# Patient Record
Sex: Female | Born: 1960 | Race: White | Hispanic: No | Marital: Married | Smoking: Former smoker
Health system: Southern US, Community
[De-identification: ages and names within clinical notes are randomized; demographics above are authoritative.]

## PROBLEM LIST (undated history)

## (undated) DIAGNOSIS — K219 Gastro-esophageal reflux disease without esophagitis: Secondary | ICD-10-CM

## (undated) DIAGNOSIS — J189 Pneumonia, unspecified organism: Secondary | ICD-10-CM

## (undated) DIAGNOSIS — J449 Chronic obstructive pulmonary disease, unspecified: Secondary | ICD-10-CM

## (undated) DIAGNOSIS — I10 Essential (primary) hypertension: Secondary | ICD-10-CM

## (undated) DIAGNOSIS — E119 Type 2 diabetes mellitus without complications: Secondary | ICD-10-CM

## (undated) DIAGNOSIS — E785 Hyperlipidemia, unspecified: Secondary | ICD-10-CM

## (undated) DIAGNOSIS — J45909 Unspecified asthma, uncomplicated: Secondary | ICD-10-CM

## (undated) DIAGNOSIS — C801 Malignant (primary) neoplasm, unspecified: Secondary | ICD-10-CM

## (undated) HISTORY — PX: OTHER SURGICAL HISTORY: SHX169

---
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
## 1988-03-05 HISTORY — PX: TUBAL LIGATION: SHX77

## 2006-03-11 ENCOUNTER — Emergency Department: Payer: Self-pay | Admitting: Emergency Medicine

## 2009-05-23 ENCOUNTER — Emergency Department: Payer: Self-pay | Admitting: Emergency Medicine

## 2009-08-16 ENCOUNTER — Emergency Department: Payer: Self-pay | Admitting: Emergency Medicine

## 2010-02-15 ENCOUNTER — Emergency Department: Payer: Self-pay | Admitting: Emergency Medicine

## 2010-09-10 ENCOUNTER — Emergency Department: Payer: Self-pay | Admitting: Emergency Medicine

## 2012-03-05 DIAGNOSIS — B9562 Methicillin resistant Staphylococcus aureus infection as the cause of diseases classified elsewhere: Secondary | ICD-10-CM

## 2012-03-05 HISTORY — DX: Methicillin resistant Staphylococcus aureus infection as the cause of diseases classified elsewhere: B95.62

## 2012-07-16 ENCOUNTER — Emergency Department: Payer: Self-pay | Admitting: Internal Medicine

## 2013-07-03 ENCOUNTER — Ambulatory Visit: Payer: Self-pay | Admitting: Nurse Practitioner

## 2013-10-18 ENCOUNTER — Inpatient Hospital Stay: Payer: Self-pay | Admitting: Internal Medicine

## 2013-10-18 LAB — COMPREHENSIVE METABOLIC PANEL
ALBUMIN: 3.6 g/dL (ref 3.4–5.0)
ALK PHOS: 85 U/L
Anion Gap: 6 — ABNORMAL LOW (ref 7–16)
BILIRUBIN TOTAL: 0.4 mg/dL (ref 0.2–1.0)
BUN: 12 mg/dL (ref 7–18)
CALCIUM: 8.6 mg/dL (ref 8.5–10.1)
CHLORIDE: 109 mmol/L — AB (ref 98–107)
CO2: 27 mmol/L (ref 21–32)
Creatinine: 0.9 mg/dL (ref 0.60–1.30)
EGFR (African American): >60
EGFR (Non-African Amer.): >60
Glucose: 129 mg/dL — ABNORMAL HIGH (ref 65–99)
Osmolality: 285 (ref 275–301)
Potassium: 4 mmol/L (ref 3.5–5.1)
SGOT(AST): 15 U/L (ref 15–37)
SGPT (ALT): 26 U/L
SODIUM: 142 mmol/L (ref 136–145)
TOTAL PROTEIN: 7.3 g/dL (ref 6.4–8.2)

## 2013-10-18 LAB — CBC
HCT: 47.1 % — ABNORMAL HIGH (ref 35.0–47.0)
HGB: 15.4 g/dL (ref 12.0–16.0)
MCH: 29.5 pg (ref 26.0–34.0)
MCHC: 32.6 g/dL (ref 32.0–36.0)
MCV: 90 fL (ref 80–100)
PLATELETS: 158 10*3/uL (ref 150–440)
RBC: 5.21 10*6/uL — AB (ref 3.80–5.20)
RDW: 13.9 % (ref 11.5–14.5)
WBC: 8 10*3/uL (ref 3.6–11.0)

## 2013-10-18 LAB — TROPONIN I: Troponin-I: <0.02 ng/mL

## 2013-10-19 LAB — BASIC METABOLIC PANEL
Anion Gap: 9 (ref 7–16)
BUN: 14 mg/dL (ref 7–18)
CO2: 24 mmol/L (ref 21–32)
CREATININE: 0.75 mg/dL (ref 0.60–1.30)
Calcium, Total: 8.5 mg/dL (ref 8.5–10.1)
Chloride: 111 mmol/L — ABNORMAL HIGH (ref 98–107)
EGFR (African American): >60
EGFR (Non-African Amer.): >60
GLUCOSE: 157 mg/dL — AB (ref 65–99)
Osmolality: 291 (ref 275–301)
Potassium: 3.5 mmol/L (ref 3.5–5.1)
Sodium: 144 mmol/L (ref 136–145)

## 2013-10-19 LAB — CBC WITH DIFFERENTIAL/PLATELET
Basophil #: 0 10*3/uL (ref 0.0–0.1)
Basophil %: 0 %
EOS ABS: 0 10*3/uL (ref 0.0–0.7)
Eosinophil %: 0 %
HCT: 44.9 % (ref 35.0–47.0)
HGB: 14.7 g/dL (ref 12.0–16.0)
Lymphocyte #: 0.8 10*3/uL — ABNORMAL LOW (ref 1.0–3.6)
Lymphocyte %: 4.2 %
MCH: 29.3 pg (ref 26.0–34.0)
MCHC: 32.7 g/dL (ref 32.0–36.0)
MCV: 90 fL (ref 80–100)
MONOS PCT: 1.9 %
Monocyte #: 0.4 x10 3/mm (ref 0.2–0.9)
NEUTROS PCT: 93.9 %
Neutrophil #: 17.1 10*3/uL — ABNORMAL HIGH (ref 1.4–6.5)
Platelet: 167 10*3/uL (ref 150–440)
RBC: 5.01 10*6/uL (ref 3.80–5.20)
RDW: 14.3 % (ref 11.5–14.5)
WBC: 18.3 10*3/uL — ABNORMAL HIGH (ref 3.6–11.0)

## 2014-06-26 NOTE — Discharge Summary (Signed)
PATIENT NAME:  Heather Nguyen, Heather Nguyen MR#:  [id number] DATE OF BIRTH:  December 24, 1960  DATE OF ADMISSION:  10/18/2013 DATE OF DISCHARGE: 10/19/2013  ADMITTING PHYSICIAN: Ennis Forts, MD  DISCHARGING PHYSICIAN: Gladstone Lighter, MD  PRIMARY PHYSICIAN: [facility].  [place]: None.  DISCHARGE DIAGNOSES: 1. Acute respiratory failure secondary to chronic obstructive pulmonary disease and asthma exacerbation. 2. Tobacco disorder.  DISCHARGE MEDICATIONS:  1.  Motrin 200 mg 1-2 tablets q. 6 hours p.r.n. for pain. 2.  Albuterol inhaler 2 puffs q. 4 hours p.r.n. for shortness of breath. 3.  Prednisone taper over 12 days. 4.  Spiriva inhaler 1 capsule daily. 5.  Advair 100/50 one puff b.i.d. 6.  Levaquin 500 mg p.o. daily for 5 days. 7.  Discharged on home oxygen 2 liters.  DISCHARGE DIET: Regular diet.  DISCHARGE ACTIVITY: As tolerated.  FOLLOWUP INSTRUCTIONS: PCP followup in 1-2 days.  LABORATORIES AND IMAGING STUDIES PRIOR TO DISCHARGE: WBC 18.3, hemoglobin 15.7, hematocrit 44.9, platelet count is 167,000.  Sodium 144, potassium 3.5, chloride 111, bicarbonate 24, BUN 14, creatinine 0.75, glucose 157, calcium of 8.5.  Chest x-ray on admission showing clear lung fields, no acute cardiopulmonary disease, reticular scarring noted in left upper lobe lingula.  ALT 26, AST 15, alkaline phosphatase 85, total bilirubin 0.4, albumin of 3.6. Troponin has been negative.  BRIEF HOSPITAL COURSE: [name] is a 54 year old obese Caucasian female with past medical history significant for COPD and asthma and ongoing smoking, comes to the hospital secondary to respiratory difficulty. 1. Acute respiratory failure secondary to asthma and COPD exacerbation, not on any home oxygen and chronically smokes. She was very hypoxic and tachypneic, that she had to be placed on high-flow nasal cannula. Next morning on my examination, the patient was significantly wheezing but she  was able to be weaned to 2 liters O2 and the patient wanted to leave, she was feeling better and back to baseline. She was still needing 2 liters of oxygen as her O2 saturations at rest on room air were 83%. She was strongly counseled about staying for 1 more day, however, the patient says she feels fine and had to take care of some stuff at home. She did qualify for home O2 because of her COPD diagnosis. She was even ready to leave AMA if I would not discharge her, but I thought maybe giving her oxygen and medications, including prednisone and antibiotic might help her feel better. So, we are discharging her with a close follow up recommendation in 1-2 days. The patient will be discharged on inhalers, prednisone taper and Levaquin at this time and also 2 liters home O2. 2. Tobacco disorder. Strongly counseled about not smoking, especially when she is being discharged on home O2 at this time.  Her condition has been otherwise uneventful in the hospital.  DISCHARGE CONDITION: Stable.  DISCHARGE DISPOSITION: Home.  TIME SPENT ON DISCHARGE: Forty minutes.  ____________________________ Gladstone Lighter, MD rk:TT D: 10/19/2013 13:13:00 ET T: 10/19/2013 14:09:34 ET JOB#: [id number]  cc: Gladstone Lighter, MD, <Dictator> Gladstone Lighter MD ELECTRONICALLY SIGNED 10/20/2013 14:18

## 2014-06-26 NOTE — Discharge Summary (Signed)
PATIENT NAME:  Heather Nguyen, Heather Nguyen MR#:  [id number] DATE OF BIRTH:  07/21/1960  DATE OF ADMISSION:  10/18/2013 DATE OF DISCHARGE: 10/19/2013   ADMITTING PHYSICIAN: Ennis Forts, MD  DISCHARGING PHYSICIAN: Gladstone Lighter, MD  PRIMARY PHYSICIAN: [facility].  [place]: None.  DISCHARGE DIAGNOSES: 1.  Acute respiratory failure secondary to chronic obstructive pulmonary disease and asthma exacerbation. 2.  Tobacco disorder.  DISCHARGE MEDICATIONS:  1.  Motrin 200 mg 1-2 tablets q. 6 hours p.r.n. for pain. 2.  Albuterol inhaler 2 puffs q. 4 hours p.r.n. for shortness of breath. 3.  Prednisone taper over 12 days. 4.  Spiriva inhaler 1 capsule daily. 5.  Advair 100/50 one puff b.i.d. 6.  Levaquin 500 mg p.o. daily for 5 days. 7.  Discharged on home oxygen 2 liters.  DISCHARGE DIET: Regular diet.  DISCHARGE ACTIVITY: As tolerated.  FOLLOWUP INSTRUCTIONS: PCP followup in 1-2 days.  LABORATORIES AND IMAGING STUDIES PRIOR TO DISCHARGE: WBC 18.3, hemoglobin 15.7, hematocrit 44.9, platelet count is 167,000.  Sodium 144, potassium 3.5, chloride 111, bicarbonate 24, BUN 14, creatinine 0.75, glucose 157, calcium of 8.5.  Chest x-ray on admission showing clear lung fields, no acute cardiopulmonary disease, reticular scarring noted in left upper lobe lingula.  ALT 26, AST 15, alkaline phosphatase 85, total bilirubin 0.4, albumin of 3.6. Troponin has been negative.  BRIEF HOSPITAL COURSE: Heather Nguyen is a 54 year old obese Caucasian female with past medical history significant for COPD and asthma and ongoing smoking, comes to the hospital secondary to respiratory difficulty. 1.  Acute respiratory failure secondary to asthma and COPD exacerbation, not on any home oxygen and chronically smokes. She was very hypoxic and tachypneic, that she had to be placed on high-flow nasal cannula. Next morning on my examination, the patient was significantly wheezing but  she was able to be weaned to 2 liters O2 and the patient wanted to leave, she was feeling better and back to baseline. She was still needing 2 liters of oxygen as her O2 saturations at rest on room air were 83%. She was strongly counseled about staying for 1 more day, however, the patient says she feels fine and had to take care of some stuff at home. She did qualify for home O2 because of her COPD diagnosis. She was even ready to leave AMA if I would not discharge her, but I thought maybe giving her oxygen and medications, including prednisone and antibiotic might help her feel better. So, we are discharging her with a close follow up recommendation in 1-2 days. The patient will be discharged on inhalers, prednisone taper and Levaquin at this time and also 2 liters home O2. 2.  Tobacco disorder. Strongly counseled about not smoking, especially when she is being discharged on home O2 at this time.  (Dictation Anomaly) <<MISSING TEXT>> in the hospital.  DISCHARGE CONDITION: Stable.  DISCHARGE DISPOSITION: Home.  TIME SPENT ON DISCHARGE: Forty minutes.   ____________________________ Lennie Muckle, MD hnc:TT D: 10/19/2013 13:13:34 ET T: 10/19/2013 14:09:34 ET JOB#: [id number]  cc: Lennie Muckle, MD, <Dictator>

## 2014-06-26 NOTE — H&P (Signed)
PATIENT NAME:  Heather Nguyen, Heather Nguyen MR#:  [id number] DATE OF BIRTH:  1960/06/14  DATE OF ADMISSION:  10/18/2013  PRIMARY CARE PHYSICIAN: Denton Lank, MD  REFERRING EMERGENCY ROOM PHYSICIAN: Graciella Freer, MD   CHIEF COMPLAINT: Shortness of breath.   HISTORY OF PRESENT ILLNESS: A 54 year old female with history of asthma and smoking. She was in her normal health until 3-4 days ago when her husband started having some flu-like symptoms, cough, sneezing and generalized body aches a few days ago and the patient also started 3 days ago feeling the same. She just thought it was the flu and would go away, but it did not, and last night she tried to take NyQuil to get relief of the symptoms which actually made the problem worse and today morning she felt severe short of breath and so decided to come to Emergency Room. In the ER, she was found hypoxic on room air with severe wheezing and so started on steroid and nebulizer treatment and oxygen therapy and given to hospitalist team for asthma exacerbation. On further questioning, she denies any fever or chills or any sputum production.   REVIEW OF SYSTEMS: CONSTITUTIONAL: Negative for fever. Positive of fatigue and generalized ache and pain. No weight loss or weight gain.  EYES: No blurring or double vision, discharge or redness.  EARS, NOSE, THROAT: No tinnitus, ear pain or hearing loss.  RESPIRATORY: The patient has cough and wheezing and shortness of breath. No sputum production. CARDIOVASCULAR: No chest pain, orthopnea, edema, arrhythmia, palpitation.  GASTROINTESTINAL: No nausea, vomiting, diarrhea, abdominal pain. GENITOURINARY: No dysuria, hematuria, increased frequency. ENDOCRINE: No heat or cold intolerance.  SKIN: No acne, rashes or lesions.  MUSCULOSKELETAL: No pain or swelling in the joints.  NEUROLOGICAL: No numbness, weakness, tremor or vertigo.  PSYCHIATRIC: No anxiety, insomnia, bipolar disorder.   PAST MEDICAL HISTORY: Asthma.   PAST  SURGICAL HISTORY: Some finger surgery for ligamental injury.   SOCIAL HISTORY: She lives with her family, is currently smoking. When she does not work, she smokes almost a pack of cigarettes a day, and when she is working she smokes 7-8 cigarettes on those days. Denies alcohol or any illegal drug use. She is working as a Hotel manager.   FAMILY HISTORY: Positive for coronary artery disease in her father and asthma in her mother.   HOME MEDICATION: She takes inhalers for asthma  PHYSICAL EXAMINATION:  VITAL SIGNS: In ER, temperature 97.8, pulse is 103, respiratory rate was 28-30, and blood pressure was 165/93 and her pulse oximetry was 84 on room air, which came up to 92 with oxygen supplementation.  GENERAL: The patient is fully alert and oriented to time, place and person. Currently using Ventimask oxygen, but cooperative with history taking and physical examination.  HEENT: Head and neck atraumatic. Conjunctivae pink. Oral mucosa moist.  NECK: Supple. No JVD.  RESPIRATORY: Bilateral equal air entry, wheezing present, some use of accessory muscles present and requiring Ventimask oxygen.  CARDIOVASCULAR: S1, S2 present, slight tachycardia. No murmur. Regular rhythm.  ABDOMEN: Soft, nontender. Bowel sounds present. No organomegaly.  SKIN: No rashes.  EXTREMITIES: Legs: No edema.  NEUROLOGICAL: Power 5/5. Follows commands. No gross abnormality.  PSYCHIATRIC: Does not appear in any acute psychiatric illness.   LABORATORY RESULTS: Glucose 234, BUN 12, creatinine 0.9, sodium 142, potassium 4.0, CO2 is 27, chloride is 109, calcium is 8.6. Total protein 7.3, albumin is 3.6, bilirubin 0.4, alkaline phosphate 85, SGOT 15, SGPT 26. Troponin less than 0.02. WBC 8, hemoglobin 15.4, platelet  count is 158,000 and MCV 90. Chest x-ray, PA and lateral is done: No active cardiopulmonary disease.   ASSESSMENT AND PLAN: A 54 year old female with a past medical history of asthma and current smoker, came to the  Emergency Room after having some flu-like symptoms for 3-4 days and severe shortness of breath today.  1.  Acute respiratory failure, hypoxia, requiring Ventimask oxygen, presented with respiratory rate of 30 and hypoxia with the use of accessory muscles for breathing. Currently, she is on oxygen supplementation. We will continue that and will treat underlying cause of asthma. She is feeling slightly better than she presented to Emergency Room.  2.  Acute asthma exacerbation. This might be exacerbated by her viral infection. Chest x-ray is clear. We will give her IV steroid and nebulizer therapy. We will also give montelukast and we will give levofloxacin to cover for any secondary infection.  3.  Smoking. Smoking cessation counseling is done for 5 minutes and offered a nicotine patch. She does not want to have any patch and she is feeling fine without that.   CODE STATUS: Full code.   TOTAL TIME SPENT ON THIS ADMISSION: Fifty minutes.    ____________________________ Ceasar Lund Anselm Jungling, MD vgv:TT D: 10/18/2013 14:14:59 ET T: 10/18/2013 14:42:31 ET JOB#: [id number]  cc: Ceasar Lund. Anselm Jungling, MD, <Dictator> Sarah "Sallie" Posey Pronto, MD Vaughan Basta MD ELECTRONICALLY SIGNED 11/08/2013 9:14

## 2014-07-06 ENCOUNTER — Other Ambulatory Visit: Payer: Self-pay | Admitting: Nurse Practitioner

## 2014-07-06 DIAGNOSIS — Z1231 Encounter for screening mammogram for malignant neoplasm of breast: Secondary | ICD-10-CM

## 2014-07-20 ENCOUNTER — Ambulatory Visit: Payer: Self-pay

## 2014-08-24 ENCOUNTER — Ambulatory Visit: Payer: Self-pay | Attending: Nurse Practitioner

## 2015-06-14 ENCOUNTER — Emergency Department
Admission: EM | Admit: 2015-06-14 | Discharge: 2015-06-14 | Disposition: A | Discharge status: Home / Self Care | Payer: 59 | Source: Home / Self Care | Attending: Emergency Medicine | Admitting: Emergency Medicine

## 2015-06-14 ENCOUNTER — Encounter: Payer: Self-pay | Admitting: Emergency Medicine

## 2015-06-14 ENCOUNTER — Emergency Department: Payer: 59

## 2015-06-14 DIAGNOSIS — J45909 Unspecified asthma, uncomplicated: Secondary | ICD-10-CM | POA: Insufficient documentation

## 2015-06-14 DIAGNOSIS — J4542 Moderate persistent asthma with status asthmaticus: Secondary | ICD-10-CM | POA: Diagnosis not present

## 2015-06-14 DIAGNOSIS — R0603 Acute respiratory distress: Secondary | ICD-10-CM

## 2015-06-14 DIAGNOSIS — R0600 Dyspnea, unspecified: Secondary | ICD-10-CM | POA: Insufficient documentation

## 2015-06-14 DIAGNOSIS — J45901 Unspecified asthma with (acute) exacerbation: Secondary | ICD-10-CM

## 2015-06-14 DIAGNOSIS — F17210 Nicotine dependence, cigarettes, uncomplicated: Secondary | ICD-10-CM | POA: Insufficient documentation

## 2015-06-14 DIAGNOSIS — I10 Essential (primary) hypertension: Secondary | ICD-10-CM

## 2015-06-14 DIAGNOSIS — R05 Cough: Secondary | ICD-10-CM | POA: Insufficient documentation

## 2015-06-14 HISTORY — DX: Unspecified asthma, uncomplicated: J45.909

## 2015-06-14 HISTORY — DX: Essential (primary) hypertension: I10

## 2015-06-14 LAB — COMPREHENSIVE METABOLIC PANEL
ALT: 24 U/L (ref 14–54)
ANION GAP: 9 (ref 5–15)
AST: 20 U/L (ref 15–41)
Albumin: 4.1 g/dL (ref 3.5–5.0)
Alkaline Phosphatase: 75 U/L (ref 38–126)
BILIRUBIN TOTAL: 0.8 mg/dL (ref 0.3–1.2)
BUN: 15 mg/dL (ref 6–20)
CALCIUM: 8.9 mg/dL (ref 8.9–10.3)
CO2: 21 mmol/L — ABNORMAL LOW (ref 22–32)
Chloride: 104 mmol/L (ref 101–111)
Creatinine, Ser: 0.64 mg/dL (ref 0.44–1.00)
GFR calc Af Amer: >60 mL/min (ref >60)
Glucose, Bld: 126 mg/dL — ABNORMAL HIGH (ref 65–99)
POTASSIUM: 3.6 mmol/L (ref 3.5–5.1)
Sodium: 134 mmol/L — ABNORMAL LOW (ref 135–145)
Total Protein: 7.6 g/dL (ref 6.5–8.1)

## 2015-06-14 LAB — CBC
HCT: 48.3 % — ABNORMAL HIGH (ref 35.0–47.0)
Hemoglobin: 16.4 g/dL — ABNORMAL HIGH (ref 12.0–16.0)
MCH: 29.4 pg (ref 26.0–34.0)
MCHC: 33.9 g/dL (ref 32.0–36.0)
MCV: 86.6 fL (ref 80.0–100.0)
PLATELETS: 162 10*3/uL (ref 150–440)
RBC: 5.58 MIL/uL — AB (ref 3.80–5.20)
RDW: 14 % (ref 11.5–14.5)
WBC: 9.8 10*3/uL (ref 3.6–11.0)

## 2015-06-14 LAB — BRAIN NATRIURETIC PEPTIDE: B Natriuretic Peptide: 64 pg/mL (ref 0.0–100.0)

## 2015-06-14 LAB — TROPONIN I

## 2015-06-14 MED ORDER — IPRATROPIUM-ALBUTEROL 0.5-2.5 (3) MG/3ML IN SOLN
3.0000 mL | Freq: Once | RESPIRATORY_TRACT | Status: AC
  Administered 2015-06-14: 3 mL via RESPIRATORY_TRACT
  Filled 2015-06-14: qty 3

## 2015-06-14 MED ORDER — METHYLPREDNISOLONE SODIUM SUCC 125 MG IJ SOLR
125.0000 mg | Freq: Once | INTRAMUSCULAR | Status: AC
  Administered 2015-06-14: 125 mg via INTRAVENOUS
  Filled 2015-06-14: qty 2

## 2015-06-14 MED ORDER — PREDNISONE 20 MG PO TABS: 40.0000 mg | ORAL_TABLET | Freq: Every day | ORAL | Status: DC

## 2015-06-14 MED ORDER — MAGNESIUM SULFATE 2 GM/50ML IV SOLN
2.0000 g | Freq: Once | INTRAVENOUS | Status: AC
  Administered 2015-06-14: 2 g via INTRAVENOUS
  Filled 2015-06-14: qty 50

## 2015-06-14 NOTE — Discharge Instructions (Signed)
Please seek medical attention for any high fevers, chest pain, shortness of breath, change in behavior, persistent vomiting, bloody stool or any other new or concerning symptoms.   Asthma, Adult Asthma is a recurring condition in which the airways tighten and narrow. Asthma can make it difficult to breathe. It can cause coughing, wheezing, and shortness of breath. Asthma episodes, also called asthma attacks, range from minor to life-threatening. Asthma cannot be cured, but medicines and lifestyle changes can help control it. CAUSES Asthma is believed to be caused by inherited (genetic) and environmental factors, but its exact cause is unknown. Asthma may be triggered by allergens, lung infections, or irritants in the air. Asthma triggers are different for each person. Common triggers include:   Animal dander.  Dust mites.  Cockroaches.  Pollen from trees or grass.  Mold.  Smoke.  Air pollutants such as dust, household cleaners, hair sprays, aerosol sprays, paint fumes, strong chemicals, or strong odors.  Cold air, weather changes, and winds (which increase molds and pollens in the air).  Strong emotional expressions such as crying or laughing hard.  Stress.  Certain medicines (such as aspirin) or types of drugs (such as beta-blockers).  Sulfites in foods and drinks. Foods and drinks that may contain sulfites include dried fruit, potato chips, and sparkling grape juice.  Infections or inflammatory conditions such as the flu, a cold, or an inflammation of the nasal membranes (rhinitis).  Gastroesophageal reflux disease (GERD).  Exercise or strenuous activity. SYMPTOMS Symptoms may occur immediately after asthma is triggered or many hours later. Symptoms include:  Wheezing.  Excessive nighttime or early morning coughing.  Frequent or severe coughing with a common cold.  Chest tightness.  Shortness of breath. DIAGNOSIS  The diagnosis of asthma is made by a review of your  medical history and a physical exam. Tests may also be performed. These may include:  Lung function studies. These tests show how much air you breathe in and out.  Allergy tests.  Imaging tests such as X-rays. TREATMENT  Asthma cannot be cured, but it can usually be controlled. Treatment involves identifying and avoiding your asthma triggers. It also involves medicines. There are 2 classes of medicine used for asthma treatment:   Controller medicines. These prevent asthma symptoms from occurring. They are usually taken every day.  Reliever or rescue medicines. These quickly relieve asthma symptoms. They are used as needed and provide short-term relief. Your health care provider will help you create an asthma action plan. An asthma action plan is a written plan for managing and treating your asthma attacks. It includes a list of your asthma triggers and how they may be avoided. It also includes information on when medicines should be taken and when their dosage should be changed. An action plan may also involve the use of a device called a peak flow meter. A peak flow meter measures how well the lungs are working. It helps you monitor your condition. HOME CARE INSTRUCTIONS   Take medicines only as directed by your health care provider. Speak with your health care provider if you have questions about how or when to take the medicines.  Use a peak flow meter as directed by your health care provider. Record and keep track of readings.  Understand and use the action plan to help minimize or stop an asthma attack without needing to seek medical care.  Control your home environment in the following ways to help prevent asthma attacks:  Do not smoke. Avoid being exposed  to secondhand smoke.  Change your heating and air conditioning filter regularly.  Limit your use of fireplaces and wood stoves.  Get rid of pests (such as roaches and mice) and their droppings.  Throw away plants if you see  mold on them.  Clean your floors and dust regularly. Use unscented cleaning products.  Try to have someone else vacuum for you regularly. Stay out of rooms while they are being vacuumed and for a short while afterward. If you vacuum, use a dust mask from a hardware store, a double-layered or microfilter vacuum cleaner bag, or a vacuum cleaner with a HEPA filter.  Replace carpet with wood, tile, or vinyl flooring. Carpet can trap dander and dust.  Use allergy-proof pillows, mattress covers, and box spring covers.  Wash bed sheets and blankets every week in hot water and dry them in a dryer.  Use blankets that are made of polyester or cotton.  Clean bathrooms and kitchens with bleach. If possible, have someone repaint the walls in these rooms with mold-resistant paint. Keep out of the rooms that are being cleaned and painted.  Wash hands frequently. SEEK MEDICAL CARE IF:   You have wheezing, shortness of breath, or a cough even if taking medicine to prevent attacks.  The colored mucus you cough up (sputum) is thicker than usual.  Your sputum changes from clear or white to yellow, green, gray, or bloody.  You have any problems that may be related to the medicines you are taking (such as a rash, itching, swelling, or trouble breathing).  You are using a reliever medicine more than 2-3 times per week.  Your peak flow is still at 50-79% of your personal best after following your action plan for 1 hour.  You have a fever. SEEK IMMEDIATE MEDICAL CARE IF:   You seem to be getting worse and are unresponsive to treatment during an asthma attack.  You are short of breath even at rest.  You get short of breath when doing very little physical activity.  You have difficulty eating, drinking, or talking due to asthma symptoms.  You develop chest pain.  You develop a fast heartbeat.  You have a bluish color to your lips or fingernails.  You are light-headed, dizzy, or faint.  Your  peak flow is less than 50% of your personal best.   This information is not intended to replace advice given to you by your health care provider. Make sure you discuss any questions you have with your health care provider.   Document Released: 02/19/2005 Document Revised: 11/10/2014 Document Reviewed: 09/18/2012 Elsevier Interactive Patient Education [employer].

## 2015-06-14 NOTE — ED Notes (Signed)
Pt arrived to the ED accompanied by her husband for complaints of chest pain starting today. Pt has been experiencing bronchitis for about a month and today the Pt thinks that her chest is hurting due to her continuous coughing. Pt's O2 sat in RA is 85 during triage. Pt placed on [place] 2LPM and O2Sats went up to 91%. MD notified. Pt is AOx4 in moderate respiratory distress.

## 2015-06-14 NOTE — ED Provider Notes (Signed)
[facility] Emergency Department Provider Note    ____________________________________________  Time seen: ~2000  I have reviewed the triage vital signs and the nursing notes.   HISTORY  Chief Complaint Chest Pain and Cough   History limited by: Not Limited   HPI Heather Nguyen is a 55 y.o. female who presents to the emergency department today because of concerns for chest pain and shortness breath. Patient states that the symptoms have been getting worse for the past 2 days. She describes the chest pain as being tightness. It has been somewhat constant. She has tried her albuterol inhalers which have slightly helped. She states she has had a cough that is been productive of clear phlegm. She denies any fevers.     Past Medical History  Diagnosis Date  . Hypertension   . Asthma     There are no active problems to display for this patient.   History reviewed. No pertinent past surgical history.  Current Outpatient Rx  Name  Route  Sig  Dispense  Refill  . albuterol (PROVENTIL HFA;VENTOLIN HFA) 108 (90 Base) MCG/ACT inhaler   Inhalation   Inhale 2 puffs into the lungs every 6 (six) hours as needed for wheezing or shortness of breath.         Marland Kitchen ibuprofen (ADVIL,MOTRIN) 200 MG tablet   Oral   Take 400 mg by mouth every 6 (six) hours as needed for headache or mild pain.         . Mometasone Furoate (ASMANEX 30 METERED DOSES) 110 MCG/INH AEPB   Inhalation   Inhale 1 puff into the lungs daily.         . Olopatadine HCl (PATADAY) 0.2 % SOLN   Both Eyes   Place 1 drop into both eyes 2 (two) times daily.           Allergies Codeine  History reviewed. No pertinent family history.  Social History Social History  Substance Use Topics  . Smoking status: Heavy Tobacco Smoker  . Smokeless tobacco: None  . Alcohol Use: No    Review of Systems  Constitutional: Negative for fever. Cardiovascular: Positive for chest  tightness Respiratory: Positive for shortness of breath Gastrointestinal: Negative for abdominal pain, vomiting and diarrhea. Neurological: Negative for headaches, focal weakness or numbness.  10-point ROS otherwise negative.  ____________________________________________   PHYSICAL EXAM:  VITAL SIGNS: ED Triage Vitals  Enc Vitals Group     BP 06/14/15 1934 163/84 mmHg     Pulse --      Resp 06/14/15 1934 22     Temp 06/14/15 1934 98.7 F (37.1 C)     Temp Source 06/14/15 1934 Oral     SpO2 06/14/15 1934 85 %     Weight 06/14/15 1934 221 lb (100.245 kg)     Height 06/14/15 1934 5\' 1"  (1.549 m)     Head Cir --      Peak Flow --      Pain Score 06/14/15 1935 6   Constitutional: Alert and oriented. In mild respiratory distress Eyes: Conjunctivae are normal. PERRL. Normal extraocular movements. ENT   Head: Normocephalic and atraumatic.   Nose: No congestion/rhinnorhea.   Mouth/Throat: Mucous membranes are moist.   Neck: No stridor. Hematological/Lymphatic/Immunilogical: No cervical lymphadenopathy. Cardiovascular: Normal rate, regular rhythm.  No murmurs, rubs, or gallops. Respiratory: Increased respiratory effort. Diffuse expiratory wheezing. Gastrointestinal: Soft and nontender. No distention. There is no CVA tenderness. Genitourinary: Deferred Musculoskeletal: Normal range of motion in all  extremities. No joint effusions.  No lower extremity tenderness nor edema. Neurologic:  Normal speech and language. No gross focal neurologic deficits are appreciated.  Skin:  Skin is warm, dry and intact. No rash noted. Psychiatric: Mood and affect are normal. Speech and behavior are normal. Patient exhibits appropriate insight and judgment.  ____________________________________________    LABS (pertinent positives/negatives)  Labs Reviewed  CBC - Abnormal; Notable for the following:    RBC 5.58 (*)    Hemoglobin 16.4 (*)    HCT 48.3 (*)    All other components  within normal limits  COMPREHENSIVE METABOLIC PANEL - Abnormal; Notable for the following:    Sodium 134 (*)    CO2 21 (*)    Glucose, Bld 126 (*)    All other components within normal limits  TROPONIN I  BRAIN NATRIURETIC PEPTIDE     ____________________________________________   EKG  I, Nance Pear, attending physician, personally viewed and interpreted this EKG  EKG Time: 1925 Rate: 107 Rhythm: sinus tachycardia Axis: normal Intervals: qtc 443 QRS: narrow ST changes: no st elevation Impression: abnormal ekg   ____________________________________________    RADIOLOGY  CXR IMPRESSION: Chronic bronchitic interstitial changes.  No acute abnormalities.  ____________________________________________   PROCEDURES  Procedure(s) performed: None  Critical Care performed: No  ____________________________________________   INITIAL IMPRESSION / ASSESSMENT AND PLAN / ED COURSE  Pertinent labs & imaging results that were available during my care of the patient were reviewed by me and considered in my medical decision making (see chart for details).  Patient presented to the emergency department today because of concerns for respiratory distress. On initial exam patient was in respiratory distress and did have bilateral wheezing. Patient continued to have some respiratory distress after Solu-Medrol, DuoNeb's and magnesium. At this point I was ready to admit the patient. The patient however declined admission. I had a discussion with the patient expressing my concerns about continued respiratory distress and hypoxia. I did discuss potential for organ damage and death. Patient verbalized understanding however declined admission. She stated she wanted to go home. She stated she was Better when she got home. I did of course encouraged the patient to return to the emergency department if she changed her mind or if symptoms got worse. Will discharge with prescription for  prednisone. Encouraged patient follow-up with primary care tomorrow.  ____________________________________________   FINAL CLINICAL IMPRESSION(S) / ED DIAGNOSES  Final diagnoses:  Respiratory distress  Asthma attack     Nance Pear, MD 06/14/15 2242

## 2015-06-16 ENCOUNTER — Encounter: Payer: Self-pay | Admitting: Emergency Medicine

## 2015-06-16 ENCOUNTER — Inpatient Hospital Stay
Admission: EM | Admit: 2015-06-16 | Discharge: 2015-06-18 | DRG: 202 | Disposition: A | Discharge status: Home / Self Care | Payer: 59 | Attending: Internal Medicine | Admitting: Internal Medicine

## 2015-06-16 ENCOUNTER — Emergency Department: Payer: 59

## 2015-06-16 DIAGNOSIS — R0902 Hypoxemia: Secondary | ICD-10-CM

## 2015-06-16 DIAGNOSIS — R0600 Dyspnea, unspecified: Secondary | ICD-10-CM | POA: Diagnosis not present

## 2015-06-16 DIAGNOSIS — E1165 Type 2 diabetes mellitus with hyperglycemia: Secondary | ICD-10-CM | POA: Diagnosis present

## 2015-06-16 DIAGNOSIS — Z825 Family history of asthma and other chronic lower respiratory diseases: Secondary | ICD-10-CM | POA: Diagnosis not present

## 2015-06-16 DIAGNOSIS — Z8249 Family history of ischemic heart disease and other diseases of the circulatory system: Secondary | ICD-10-CM

## 2015-06-16 DIAGNOSIS — Z885 Allergy status to narcotic agent status: Secondary | ICD-10-CM

## 2015-06-16 DIAGNOSIS — J4542 Moderate persistent asthma with status asthmaticus: Secondary | ICD-10-CM | POA: Diagnosis present

## 2015-06-16 DIAGNOSIS — I10 Essential (primary) hypertension: Secondary | ICD-10-CM | POA: Diagnosis present

## 2015-06-16 DIAGNOSIS — Z89022 Acquired absence of left finger(s): Secondary | ICD-10-CM | POA: Diagnosis not present

## 2015-06-16 DIAGNOSIS — F17210 Nicotine dependence, cigarettes, uncomplicated: Secondary | ICD-10-CM | POA: Diagnosis present

## 2015-06-16 DIAGNOSIS — J449 Chronic obstructive pulmonary disease, unspecified: Secondary | ICD-10-CM | POA: Diagnosis present

## 2015-06-16 DIAGNOSIS — J45901 Unspecified asthma with (acute) exacerbation: Secondary | ICD-10-CM | POA: Diagnosis not present

## 2015-06-16 DIAGNOSIS — J9601 Acute respiratory failure with hypoxia: Secondary | ICD-10-CM | POA: Diagnosis present

## 2015-06-16 DIAGNOSIS — J441 Chronic obstructive pulmonary disease with (acute) exacerbation: Secondary | ICD-10-CM | POA: Diagnosis not present

## 2015-06-16 DIAGNOSIS — J9600 Acute respiratory failure, unspecified whether with hypoxia or hypercapnia: Secondary | ICD-10-CM

## 2015-06-16 DIAGNOSIS — Z6841 Body Mass Index (BMI) 40.0 and over, adult: Secondary | ICD-10-CM

## 2015-06-16 LAB — CBC
HCT: 48.4 % — ABNORMAL HIGH (ref 35.0–47.0)
Hemoglobin: 16.1 g/dL — ABNORMAL HIGH (ref 12.0–16.0)
MCH: 28.8 pg (ref 26.0–34.0)
MCHC: 33.3 g/dL (ref 32.0–36.0)
MCV: 86.5 fL (ref 80.0–100.0)
PLATELETS: 180 10*3/uL (ref 150–440)
RBC: 5.6 MIL/uL — ABNORMAL HIGH (ref 3.80–5.20)
RDW: 13.9 % (ref 11.5–14.5)
WBC: 13.1 10*3/uL — ABNORMAL HIGH (ref 3.6–11.0)

## 2015-06-16 LAB — BASIC METABOLIC PANEL
Anion gap: 8 (ref 5–15)
BUN: 22 mg/dL — AB (ref 6–20)
CO2: 22 mmol/L (ref 22–32)
CREATININE: 0.78 mg/dL (ref 0.44–1.00)
Calcium: 8.9 mg/dL (ref 8.9–10.3)
Chloride: 105 mmol/L (ref 101–111)
GFR calc Af Amer: >60 mL/min (ref >60)
GLUCOSE: 175 mg/dL — AB (ref 65–99)
Potassium: 3.9 mmol/L (ref 3.5–5.1)
SODIUM: 135 mmol/L (ref 135–145)

## 2015-06-16 LAB — GLUCOSE, CAPILLARY
GLUCOSE-CAPILLARY: 230 mg/dL — AB (ref 65–99)
GLUCOSE-CAPILLARY: 192 mg/dL — AB (ref 65–99)
Glucose-Capillary: 262 mg/dL — ABNORMAL HIGH (ref 65–99)

## 2015-06-16 LAB — TROPONIN I

## 2015-06-16 LAB — BRAIN NATRIURETIC PEPTIDE: B NATRIURETIC PEPTIDE 5: 64 pg/mL (ref 0.0–100.0)

## 2015-06-16 LAB — MRSA PCR SCREENING: MRSA BY PCR: NEGATIVE

## 2015-06-16 MED ORDER — ALBUTEROL (5 MG/ML) CONTINUOUS INHALATION SOLN: 7.5000 mg/h | INHALATION_SOLUTION | RESPIRATORY_TRACT | Status: DC

## 2015-06-16 MED ORDER — ONDANSETRON HCL 4 MG PO TABS: 4.0000 mg | ORAL_TABLET | Freq: Four times a day (QID) | ORAL | Status: DC | PRN

## 2015-06-16 MED ORDER — BUDESONIDE 0.25 MG/2ML IN SUSP
0.2500 mg | Freq: Four times a day (QID) | RESPIRATORY_TRACT | Status: DC
  Administered 2015-06-16 – 2015-06-18 (×9): 0.25 mg via RESPIRATORY_TRACT
  Filled 2015-06-16 (×9): qty 2

## 2015-06-16 MED ORDER — INSULIN ASPART 100 UNIT/ML ~~LOC~~ SOLN
0.0000 [IU] | Freq: Three times a day (TID) | SUBCUTANEOUS | Status: DC
  Administered 2015-06-16: 5 [IU] via SUBCUTANEOUS
  Administered 2015-06-17 – 2015-06-18 (×5): 2 [IU] via SUBCUTANEOUS
  Filled 2015-06-16: qty 2
  Filled 2015-06-16: qty 5
  Filled 2015-06-16 (×3): qty 2

## 2015-06-16 MED ORDER — METHYLPREDNISOLONE SODIUM SUCC 125 MG IJ SOLR: 60.0000 mg | Freq: Three times a day (TID) | INTRAMUSCULAR | Status: DC

## 2015-06-16 MED ORDER — ACETAMINOPHEN 650 MG RE SUPP: 650.0000 mg | Freq: Four times a day (QID) | RECTAL | Status: DC | PRN

## 2015-06-16 MED ORDER — DOCUSATE SODIUM 100 MG PO CAPS
100.0000 mg | ORAL_CAPSULE | Freq: Two times a day (BID) | ORAL | Status: DC
  Administered 2015-06-16: 100 mg via ORAL
  Filled 2015-06-16 (×4): qty 1

## 2015-06-16 MED ORDER — INSULIN ASPART 100 UNIT/ML ~~LOC~~ SOLN
0.0000 [IU] | Freq: Every day | SUBCUTANEOUS | Status: DC
  Filled 2015-06-16: qty 2

## 2015-06-16 MED ORDER — ALBUTEROL SULFATE (2.5 MG/3ML) 0.083% IN NEBU
2.5000 mg | INHALATION_SOLUTION | RESPIRATORY_TRACT | Status: AC
  Administered 2015-06-16: 2.5 mg via RESPIRATORY_TRACT

## 2015-06-16 MED ORDER — IPRATROPIUM-ALBUTEROL 0.5-2.5 (3) MG/3ML IN SOLN
3.0000 mL | Freq: Four times a day (QID) | RESPIRATORY_TRACT | Status: DC
  Administered 2015-06-16: 3 mL via RESPIRATORY_TRACT
  Filled 2015-06-16: qty 3

## 2015-06-16 MED ORDER — IPRATROPIUM-ALBUTEROL 0.5-2.5 (3) MG/3ML IN SOLN
RESPIRATORY_TRACT | Status: AC
  Administered 2015-06-16: 3 mL via RESPIRATORY_TRACT
  Filled 2015-06-16: qty 6

## 2015-06-16 MED ORDER — IPRATROPIUM-ALBUTEROL 0.5-2.5 (3) MG/3ML IN SOLN
3.0000 mL | Freq: Once | RESPIRATORY_TRACT | Status: AC
  Administered 2015-06-16: 3 mL via RESPIRATORY_TRACT

## 2015-06-16 MED ORDER — ALBUTEROL SULFATE (2.5 MG/3ML) 0.083% IN NEBU: 2.5000 mg | INHALATION_SOLUTION | RESPIRATORY_TRACT | Status: DC | PRN

## 2015-06-16 MED ORDER — METHYLPREDNISOLONE SODIUM SUCC 40 MG IJ SOLR
40.0000 mg | Freq: Two times a day (BID) | INTRAMUSCULAR | Status: DC
  Administered 2015-06-16 – 2015-06-18 (×4): 40 mg via INTRAVENOUS
  Filled 2015-06-16 (×4): qty 1

## 2015-06-16 MED ORDER — IPRATROPIUM-ALBUTEROL 0.5-2.5 (3) MG/3ML IN SOLN
RESPIRATORY_TRACT | Status: AC
  Filled 2015-06-16: qty 3

## 2015-06-16 MED ORDER — ONDANSETRON HCL 4 MG/2ML IJ SOLN: 4.0000 mg | Freq: Four times a day (QID) | INTRAMUSCULAR | Status: DC | PRN

## 2015-06-16 MED ORDER — LEVOFLOXACIN 500 MG PO TABS
500.0000 mg | ORAL_TABLET | Freq: Every day | ORAL | Status: DC
  Administered 2015-06-17 – 2015-06-18 (×2): 500 mg via ORAL
  Filled 2015-06-16 (×2): qty 1

## 2015-06-16 MED ORDER — ENOXAPARIN SODIUM 40 MG/0.4ML ~~LOC~~ SOLN
40.0000 mg | SUBCUTANEOUS | Status: DC
  Administered 2015-06-16: 40 mg via SUBCUTANEOUS
  Filled 2015-06-16: qty 0.4

## 2015-06-16 MED ORDER — METHYLPREDNISOLONE SODIUM SUCC 125 MG IJ SOLR
INTRAMUSCULAR | Status: AC
  Administered 2015-06-16: 125 mg via INTRAVENOUS
  Filled 2015-06-16: qty 2

## 2015-06-16 MED ORDER — METHYLPREDNISOLONE SODIUM SUCC 125 MG IJ SOLR
125.0000 mg | INTRAMUSCULAR | Status: AC
  Administered 2015-06-16: 125 mg via INTRAVENOUS

## 2015-06-16 MED ORDER — MOMETASONE FUROATE 110 MCG/INH IN AEPB: 1.0000 | INHALATION_SPRAY | Freq: Every day | RESPIRATORY_TRACT | Status: DC

## 2015-06-16 MED ORDER — LISINOPRIL 5 MG PO TABS
5.0000 mg | ORAL_TABLET | Freq: Every day | ORAL | Status: DC
  Administered 2015-06-16 – 2015-06-18 (×3): 5 mg via ORAL
  Filled 2015-06-16 (×3): qty 1

## 2015-06-16 MED ORDER — ACETAMINOPHEN 325 MG PO TABS: 650.0000 mg | ORAL_TABLET | Freq: Four times a day (QID) | ORAL | Status: DC | PRN

## 2015-06-16 MED ORDER — ALBUTEROL SULFATE (2.5 MG/3ML) 0.083% IN NEBU
INHALATION_SOLUTION | RESPIRATORY_TRACT | Status: AC
  Administered 2015-06-16: 2.5 mg via RESPIRATORY_TRACT
  Filled 2015-06-16: qty 9

## 2015-06-16 MED ORDER — IPRATROPIUM-ALBUTEROL 0.5-2.5 (3) MG/3ML IN SOLN: 3.0000 mL | RESPIRATORY_TRACT | Status: DC

## 2015-06-16 MED ORDER — POLYETHYLENE GLYCOL 3350 17 G PO PACK: 17.0000 g | PACK | Freq: Every day | ORAL | Status: DC | PRN

## 2015-06-16 MED ORDER — IPRATROPIUM-ALBUTEROL 0.5-2.5 (3) MG/3ML IN SOLN
3.0000 mL | Freq: Four times a day (QID) | RESPIRATORY_TRACT | Status: DC
  Administered 2015-06-16 – 2015-06-18 (×8): 3 mL via RESPIRATORY_TRACT
  Filled 2015-06-16 (×8): qty 3

## 2015-06-16 MED ORDER — LEVOFLOXACIN IN D5W 500 MG/100ML IV SOLN
500.0000 mg | INTRAVENOUS | Status: AC
  Administered 2015-06-16: 500 mg via INTRAVENOUS
  Filled 2015-06-16: qty 100

## 2015-06-16 NOTE — H&P (Signed)
[facility] [place] NAME: Heather Nguyen    MR#:  [id number]  DATE OF BIRTH:  February 14, 1961  DATE OF ADMISSION:  06/16/2015  PRIMARY CARE PHYSICIAN: KC [place].  REQUESTING/REFERRING PHYSICIAN: Dr. Delman Kitten  CHIEF COMPLAINT:   Chief Complaint  Patient presents with  . Shortness of Breath    HISTORY OF PRESENT ILLNESS:  Heather Nguyen  is a 55 y.o. female with a known history of asthma , hypertension, not on home oxygen and ongoing smoking present to the hospital secondary to worsening shortness of breath. Patient symptoms symptoms started about 4 days ago with cough, chest tightness and fevers and chills. Her husband was sick with bronchitis for the last couple of weeks. She was in the emergency room about 3 days ago and was treated with nebs and discharged on prednisone. Her symptom did not improve but she got worse a presented again. She was very hypoxic on oxygen today and is placed on BiPAP mask.  PAST MEDICAL HISTORY:   Past Medical History  Diagnosis Date  . Hypertension   . Asthma     PAST SURGICAL HISTORY:   Past Surgical History  Procedure Laterality Date  . 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 Smoking status: Heavy Tobacco Smoker -- 1.00 packs/day    Types: Cigarettes  . Smokeless tobacco: Not on file  . Alcohol Use: No    FAMILY HISTORY:   Family History  Problem Relation Age of Onset  . CAD Father   . Asthma Mother     DRUG ALLERGIES:   Allergies  Allergen Reactions  . Codeine Anaphylaxis    REVIEW OF SYSTEMS:   Review of Systems  Constitutional: Positive for fever and malaise/fatigue. Negative for chills and weight loss.  HENT: Negative for ear discharge, ear pain, hearing loss, nosebleeds and tinnitus.   Eyes: Negative for blurred vision, double vision and photophobia.  Respiratory: Positive for cough and  shortness of breath. Negative for hemoptysis and wheezing.   Cardiovascular: Negative for chest pain, palpitations, orthopnea and leg swelling.  Gastrointestinal: Negative for heartburn, nausea, vomiting, abdominal pain, diarrhea, constipation and melena.  Genitourinary: Negative for dysuria, urgency, frequency and hematuria.  Musculoskeletal: Negative for myalgias, back pain and neck pain.  Skin: Negative for rash.  Neurological: Negative for dizziness, tremors, sensory change, speech change, focal weakness and headaches.  Endo/Heme/Allergies: Does not bruise/bleed easily.  Psychiatric/Behavioral: Negative for depression.    MEDICATIONS AT HOME:   Prior to Admission medications   Medication Sig Start Date End Date Taking? Authorizing Provider  albuterol (PROVENTIL HFA;VENTOLIN HFA) 108 (90 Base) MCG/ACT inhaler Inhale 2 puffs into the lungs every 6 (six) hours as needed for wheezing or shortness of breath.   Yes Historical Provider, MD  ibuprofen (ADVIL,MOTRIN) 200 MG tablet Take 400 mg by mouth every 6 (six) hours as needed for headache or mild pain.   Yes Historical Provider, MD  lisinopril (PRINIVIL,ZESTRIL) 5 MG tablet Take 5 mg by mouth daily.   Yes Historical Provider, MD  Mometasone Furoate (ASMANEX 30 METERED DOSES) 110 MCG/INH AEPB Inhale 1 puff into the lungs daily.   Yes Historical Provider, MD  predniSONE (DELTASONE) 20 MG tablet Take 2 tablets (40 mg total) by mouth daily. 06/14/15  Yes Nance Pear, MD      VITAL SIGNS:  Blood pressure 107/66, pulse  108, temperature 98.7 F (37.1 C), temperature source Oral, resp. rate 27, height 5\' 1"  (1.549 m), weight 99.791 kg (220 lb), SpO2 93 %.  PHYSICAL EXAMINATION:   Physical Exam  GENERAL:  55 y.o.-year-old obese patient lying in the bed,  Critically ill appearing. Currently on BiPAP mask EYES: Pupils equal, round, reactive to light and accommodation. No scleral icterus. Extraocular muscles intact.  HEENT: Head atraumatic,  normocephalic. Oropharynx and nasopharynx clear. BiPAPin place NECK:  Supple, no jugular venous distention. No thyroid enlargement, no tenderness.  LUNGS: coarse wheezing all over the lung fields, Using accessory muscles to breathe with minimal exertion.no crackles or rhochi or rales. No use of accessory muscles of respiration.  CARDIOVASCULAR: S1, S2 normal. No rubs, or gallops. 2/6 systolic murmur ABDOMEN: Soft, nontender, nondistended. Bowel sounds present. No organomegaly or mass.  EXTREMITIES: No pedal edema, cyanosis, or clubbing.  NEUROLOGIC: Cranial nerves II through XII are intact. Muscle strength 5/5 in all extremities. Sensation intact. Gait not checked.  PSYCHIATRIC: The patient is alert and oriented x 3.  SKIN: No obvious rash, lesion, or ulcer.   LABORATORY PANEL:   CBC  Recent Labs Lab 06/16/15 0955  WBC 13.1*  HGB 16.1*  HCT 48.4*  PLT 180   ------------------------------------------------------------------------------------------------------------------  Chemistries   Recent Labs Lab 06/14/15 1955 06/16/15 0955  NA 134* 135  K 3.6 3.9  CL 104 105  CO2 21* 22  GLUCOSE 126* 175*  BUN 15 22*  CREATININE 0.64 0.78  CALCIUM 8.9 8.9  AST 20  --   ALT 24  --   ALKPHOS 75  --   BILITOT 0.8  --    ------------------------------------------------------------------------------------------------------------------  Cardiac Enzymes  Recent Labs Lab 06/16/15 0955  TROPONINI <0.03   ------------------------------------------------------------------------------------------------------------------  RADIOLOGY:  Dg Chest 2 View  06/14/2015  CLINICAL DATA:  Shortness of breath for 2 days, central LEFT chest pain beginning this afternoon, diagnosed with bronchitis 1 month ago, history of asthma, hypertension, and smoking EXAM: CHEST  2 VIEW COMPARISON:  10/18/2013 FINDINGS: Normal heart size, mediastinal contours and pulmonary vascularity. Chronic bronchitic  interstitial changes stable since previous exam. No acute infiltrate, pleural effusion or pneumothorax. Bones demineralized with mild scattered degenerative changes thoracic spine. IMPRESSION: Chronic bronchitic interstitial changes. No acute abnormalities. Electronically Signed   By: Lavonia Dana M.D.   On: 06/14/2015 20:39   Dg Chest Port 1 View  06/16/2015  CLINICAL DATA:  Shortness of breath.  Smoker. EXAM: PORTABLE CHEST 1 VIEW COMPARISON:  06/14/2015 FINDINGS: The cardiomediastinal silhouette is within normal limits. Chronic interstitial changes are similar to the prior study and most conspicuous in the lung bases. No definite acute airspace consolidation, overt pulmonary edema, pleural effusion, or pneumothorax is identified. No acute osseous abnormality is seen. IMPRESSION: Similar appearance of chronic bronchitic/interstitial changes. Electronically Signed   By: Logan Bores M.D.   On: 06/16/2015 10:25    EKG:   Orders placed or performed during the hospital encounter of 06/16/15  . EKG 12-Lead  . EKG 12-Lead    IMPRESSION AND PLAN:   Purvi Ridgell  is a 55 y.o. female with a known history of asthma , hypertension, not on home oxygen and ongoing smoking present to the hospital secondary to worsening shortness of breath.   #1 ACUTE HYPOXIC RESPIRATORY FAILURe- secondary to asthma exacerbation with acute bronchits  - admit to stepdown unit. Continue BiPAP - intensivist consult. Started on IV steroids, DuoNeb's -started on Levaquin for bronchitis  #2 HTN- cont lisinopril  #  3 Tobacco use disorder- counseled for 28min, started on nicotine patch  #4 DVT Prophylaxis- lovenox   All the records are reviewed and case discussed with ED provider. Management plans discussed with the patient, family and they are in agreement.  CODE STATUS: Full Code  TOTAL CRITICAL CARE TIME spent in TAKING CARE OF THIS PATIENT: 55 minutes.    Gladstone Lighter M.D on 06/16/2015 at 11:43 AM  Between  7am to 6pm - Pager - [phone]  After 6pm go to [email or web address] - password [email or web address] [email or web address]  [facility]  Office  [phone]  CC: Primary care physician; No primary care provider on file.

## 2015-06-16 NOTE — ED Notes (Signed)
Reports sob for several days seen here for the same 2 days ago, not getting better.  Labored breathing on arrival.  Pt to room 12

## 2015-06-16 NOTE — ED Notes (Signed)
Pt states she feels much better with bipap and neb Tx. Pt appears more comfortable, respirations unlabored, pt resting comfortably.Marland Kitchen

## 2015-06-16 NOTE — ED Notes (Signed)
Pt states she feels a little better but O2 sats still low.  Pt now on 6L O2 with sat of 88%.  Dr. Jacqualine Code at bedside.

## 2015-06-16 NOTE — Progress Notes (Signed)
Pharmacy Antibiotic Note  Heather Nguyen is a 55 y.o. female admitted on 06/16/2015 with bronchitis.  Pharmacy has been consulted for levofloxacin dosing.  Plan: Levofloxacin 500 mg IV q 24 hours  Height: 5\' 1"  (154.9 cm) Weight: 220 lb (99.791 kg) IBW/kg (Calculated) : 47.8  Temp (24hrs), Avg:98.7 F (37.1 C), Min:98.7 F (37.1 C), Max:98.7 F (37.1 C)   Recent Labs Lab 06/14/15 1955 06/16/15 0955  WBC 9.8 13.1*  CREATININE 0.64 0.78    Estimated Creatinine Clearance: 86 mL/min (by C-G formula based on Cr of 0.78).    Allergies  Allergen Reactions  . Codeine Anaphylaxis    Antimicrobials this admission: levofloxacin 4/13 >>   Microbiology results: None  Thank you for allowing pharmacy to be a part of this patient's care.  Lenis Noon, PharmD Clinical Pharmacist 06/16/2015 12:28 PM

## 2015-06-16 NOTE — ED Provider Notes (Signed)
[facility] Emergency Department Provider Note  ____________________________________________  Time seen: Approximately 10:01 AM  I have reviewed the triage vital signs and the nursing notes.   HISTORY  Chief Complaint Shortness of Breath    HPI MOO HOLZMANN is a 55 y.o. female [place]. History of asthma. She reports she's been wheezing for about a week now, despite taking prednisone, albuterol at home her symptoms are not improving. She continues to itch. Shortness of breath and wheezing especially worsened when she tries to exert herself. No chest pain, does report she's had a slight productive cough associated. She is a smoker.     Past Medical History  Diagnosis Date  . Hypertension   . Asthma     There are no active problems to display for this patient.   History reviewed. No pertinent past surgical history.  Current Outpatient Rx  Name  Route  Sig  Dispense  Refill  . albuterol (PROVENTIL HFA;VENTOLIN HFA) 108 (90 Base) MCG/ACT inhaler   Inhalation   Inhale 2 puffs into the lungs every 6 (six) hours as needed for wheezing or shortness of breath.         Marland Kitchen ibuprofen (ADVIL,MOTRIN) 200 MG tablet   Oral   Take 400 mg by mouth every 6 (six) hours as needed for headache or mild pain.         Marland Kitchen lisinopril (PRINIVIL,ZESTRIL) 5 MG tablet   Oral   Take 5 mg by mouth daily.         . Mometasone Furoate (ASMANEX 30 METERED DOSES) 110 MCG/INH AEPB   Inhalation   Inhale 1 puff into the lungs daily.         . predniSONE (DELTASONE) 20 MG tablet   Oral   Take 2 tablets (40 mg total) by mouth daily.   8 tablet   0     Allergies Codeine  No family history on file.  Social History Social History  Substance Use Topics  . Smoking status: Heavy Tobacco Smoker  . Smokeless tobacco: None  . Alcohol Use: No    Review of Systems Constitutional: No fever/chills Eyes: No visual changes. ENT: No sore throat. Cardiovascular: Denies  chest pain. Respiratory: See history of present illness  Gastrointestinal: No abdominal pain.  No nausea, no vomiting.   Genitourinary: Negative for dysuria. Musculoskeletal: Negative for back pain. Skin: Negative for rash. Neurological: Negative for headaches, focal weakness or numbness.  10-point ROS otherwise negative.  ____________________________________________   PHYSICAL EXAM:  VITAL SIGNS: ED Triage Vitals  Enc Vitals Group     BP 06/16/15 0959 147/87 mmHg     Pulse Rate 06/16/15 0959 96     Resp 06/16/15 0959 30     Temp 06/16/15 0959 98.7 F (37.1 C)     Temp Source 06/16/15 0959 Oral     SpO2 06/16/15 0959 81 %     Weight 06/16/15 0959 220 lb (99.791 kg)     Height 06/16/15 0959 5\' 1"  (1.549 m)     Head Cir --      Peak Flow --      Pain Score --      Pain Loc --      Pain Edu? --      Excl. in [place]? --    Constitutional: Alert and oriented. Patient noted to be in moderate respiratory distress, mild accessory muscle use with audible wheezing. Speaks in very short one to 2 word phrases. Eyes: Conjunctivae are normal.  PERRL. EOMI. Head: Atraumatic. Nose: No congestion/rhinnorhea. Mouth/Throat: Mucous membranes are moist.  Oropharynx non-erythematous. Neck: No stridor.   Cardiovascular: Normal rate, regular rhythm. Grossly normal heart sounds.  Good peripheral circulation. Respiratory: Normal respiratory effort.  No retractions. Lungs CTAB. Gastrointestinal: Soft and nontender. No distention. No abdominal bruits. No CVA tenderness. Musculoskeletal: No lower extremity tenderness nor edema.  No joint effusions. Neurologic:  Normal speech and language. No gross focal neurologic deficits are appreciated. No gait instability. Skin:  Skin is warm, dry and intact. No rash noted. Psychiatric: Mood and affect are normal. Speech and behavior are normal.  ____________________________________________   LABS (all labs ordered are listed, but only abnormal results are  displayed)  Labs Reviewed  CBC - Abnormal; Notable for the following:    WBC 13.1 (*)    RBC 5.60 (*)    Hemoglobin 16.1 (*)    HCT 48.4 (*)    All other components within normal limits  BASIC METABOLIC PANEL - Abnormal; Notable for the following:    Glucose, Bld 175 (*)    BUN 22 (*)    All other components within normal limits  TROPONIN I  BRAIN NATRIURETIC PEPTIDE   ____________________________________________  EKG  ED ECG REPORT I, Keonte Daubenspeck, the attending physician, personally viewed and interpreted this ECG.  Date: 06/16/2015 EKG Time: 10 AM Rate: 96 Rhythm: normal sinus rhythm QRS Axis: normal Intervals: normal ST/T Wave abnormalities: normal Conduction Disturbances: none Narrative Interpretation: unremarkable  ____________________________________________  RADIOLOGY  DG Chest Port 1 View (Final result) Result time: 06/16/15 10:25:20   Final result by Rad Results In Interface (06/16/15 10:25:20)   Narrative:   CLINICAL DATA: Shortness of breath. Smoker.  EXAM: PORTABLE CHEST 1 VIEW  COMPARISON: 06/14/2015  FINDINGS: The cardiomediastinal silhouette is within normal limits. Chronic interstitial changes are similar to the prior study and most conspicuous in the lung bases. No definite acute airspace consolidation, overt pulmonary edema, pleural effusion, or pneumothorax is identified. No acute osseous abnormality is seen.  IMPRESSION: Similar appearance of chronic bronchitic/interstitial changes.   Electronically Signed By: Logan Bores M.D. On: 06/16/2015 10:25    ____________________________________________   PROCEDURES  Procedure(s) performed: None  Critical Care performed: Yes, see critical care note(s)  CRITICAL CARE Performed by: Delman Kitten   Total critical care time: 40 minutes  Critical care time was exclusive of separately billable procedures and treating other patients.  Critical care was necessary to treat or  prevent imminent or life-threatening deterioration.  Critical care was time spent personally by me on the following activities: development of treatment plan with patient and/or surrogate as well as nursing, discussions with consultants, evaluation of patient's response to treatment, examination of patient, obtaining history from patient or surrogate, ordering and performing treatments and interventions, ordering and review of laboratory studies, ordering and review of radiographic studies, pulse oximetry and re-evaluation of patient's condition.  ____________________________________________   INITIAL IMPRESSION / ASSESSMENT AND PLAN / ED COURSE  Pertinent labs & imaging results that were available during my care of the patient were reviewed by me and considered in my medical decision making (see chart for details).  Patient presents with audible wheezing, moderate increased work of breathing, hypoxia, with a history of asthma. Clinical history seems to be that of a status asthmaticus, COPD, bronchitis, however differential diagnosis would certainly include other causes including acute coronary syndrome, find far less likely acute thromboembolic syndrome such as pulmonary embolism, pneumothorax, pneumonia, etc.   ----------------------------------------- 10:28 AM on 06/16/2015 -----------------------------------------  Patient continues moderate work of breathing, oxygen saturation 89% on 3 L nasal cannula. Ongoing moderate wheezing throughout, expiratory. She is awake alert, but does show signs of ongoing moderate work of breathing and hypoxia. We'll trial BiPAP for improvement. Placed and discussed with Dr. Jamal Collin critical care a consultation, who advises he will see the patient in consult and to admit to stepdown unit via the hospitalist.  ----------------------------------------- 10:54 AM on 06/16/2015 -----------------------------------------  Patient tolerating BiPAP well. Currently  oxygenating 93%, overall does report improvement. Continue with plan for admission, ongoing treatments.  ____________________________________________   FINAL CLINICAL IMPRESSION(S) / ED DIAGNOSES  Final diagnoses:  Hypoxia  Asthma with status asthmaticus, moderate persistent      Delman Kitten, MD 06/16/15 1055

## 2015-06-16 NOTE — ED Notes (Signed)
Pt reports seen here and treated for copd 2 days ago.  States she is taking prednisone but still having sob.  Pt arrived with labored breathing and 81%on RA.  O2 sat up to 96% on 4L.  Receiving duoneb at this time.  Skin w/d with good color at this time. Dr. Jacqualine Code at bedside. Sinus tach on monitor.

## 2015-06-16 NOTE — ED Notes (Signed)
RT at bedside for bipap.

## 2015-06-16 NOTE — Consult Note (Signed)
PULMONARY CONSULT NOTE  Requesting MD/Service: Kalisetti/[facility] Date of initial consultation: 06/16/15 Reason for consultation: Acute asthmatic bronchitis  HPI:  38 F smoker with hx of asthma first diagnosed at age 55 with persistent bronchitis symptoms X one month, seen in ED 04/12 and diagnosed as asthma exacerbation. Discharged to home with prednisone. Presented again to ED on the day of this admission with subjective fever and much increased SOB. Was in extremis in ED and placed on BiPAP. Much improved by time of arrival to ICU/SDU after nebulized bronchodilators. Cognition is intact. At her baseline, she is maintained on Asmanex and PRN albuterol which she uses 3-4 times per week. She smokes 1/2 ppd and exhibits little insight into the relationship between smoking and her respiratory problems.    Past Medical History  Diagnosis Date  . Hypertension   . Asthma     Past Surgical History  Procedure Laterality Date  . 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 Marital Status: Divorced    Spouse Name: N/A  . Number of Children: N/A  . Years of Education: N/A   Occupational History  . Not on file.   Social History Main Topics  . Smoking status: Heavy Tobacco Smoker -- 1.00 packs/day    Types: Cigarettes  . Smokeless tobacco: Not on file  . Alcohol Use: No  . Drug Use: No  . Sexual Activity: Yes   Other Topics Concern  . Not on file   Social History Narrative   Lives at home with husband    Family History  Problem Relation Age of Onset  . CAD Father   . Asthma Mother     ROS: No myalgias/arthralgias, unexplained weight loss or weight gain No new focal weakness or sensory deficits No otalgia, hearing loss, visual changes, nasal and sinus symptoms, mouth and throat problems No neck pain or adenopathy No abdominal pain, N/V/D,  diarrhea, change in bowel pattern No dysuria, change in urinary pattern No LE edema or calf tenderness   Filed Vitals:   06/16/15 1102 06/16/15 1148 06/16/15 1209 06/16/15 1249  BP: 107/66 123/64 129/68 126/96  Pulse: 108 102 97 95  Temp:      TempSrc:      Resp: 27 22 30 30   Height:      Weight:      SpO2: 93% 94% 92% 94%     EXAM:  Gen: Obese, mildly breathless with speech HEENT: NCAT, sclera white, oropharynx normal Neck: Supple without LAN, thyromegaly, JVD Lungs: breath sounds full, percussion note normal, diffuse coarse wheezes Cardiovascular: tachy, reg, no murmurs noted Abdomen: Obese, soft, nontender, normal BS Ext: without clubbing, cyanosis, edema Neuro: CNs grossly intact, motor and sensory intact, DTRs symmetric Skin: Limited exam, no lesions noted  DATA:   BMP Latest Ref Rng 06/16/2015 06/14/2015 10/19/2013  Glucose 65 - 99 mg/dL 175(H) 126(H) 157(H)  BUN 6 - 20 mg/dL 22(H) 15 14  Creatinine 0.44 - 1.00 mg/dL 0.78 0.64 0.75  Sodium 135 - 145 mmol/L 135 134(L) 144  Potassium 3.5 - 5.1 mmol/L 3.9 3.6 3.5  Chloride 101 - 111 mmol/L 105 104 111(H)  CO2 22 - 32 mmol/L 22 21(L) 24  Calcium 8.9 - 10.3 mg/dL 8.9 8.9 8.5    CBC Latest Ref Rng 06/16/2015 06/14/2015 10/19/2013  WBC 3.6 - 11.0 K/uL 13.1(H) 9.8  18.3(H)  Hemoglobin 12.0 - 16.0 g/dL 16.1(H) 16.4(H) 14.7  Hematocrit 35.0 - 47.0 % 48.4(H) 48.3(H) 44.9  Platelets 150 - 440 K/uL 180 162 167    CXR:  NACPD  IMPRESSION:   1) Acute hypoxic respiratory failure 2) Acute asthmatic bronchitis  3) suspect COPD 4) smoker 5) steroid and stress induced hyperglycemia  PLAN:  SDU level of care PRN BiPAP Supplemental O2 as needed to maintain SpO2 > 92%a Systemic steroids Nebulized steroids and bronchodilators Empiric levofloxacin Counseled emphatically re: smoking cessation  Merton Border, MD PCCM service Mobile [phone] Pager [phone] 06/16/2015

## 2015-06-17 DIAGNOSIS — J441 Chronic obstructive pulmonary disease with (acute) exacerbation: Secondary | ICD-10-CM

## 2015-06-17 LAB — CBC
HEMATOCRIT: 43.5 % (ref 35.0–47.0)
HEMOGLOBIN: 14.5 g/dL (ref 12.0–16.0)
MCH: 28.7 pg (ref 26.0–34.0)
MCHC: 33.4 g/dL (ref 32.0–36.0)
MCV: 85.9 fL (ref 80.0–100.0)
Platelets: 161 10*3/uL (ref 150–440)
RBC: 5.06 MIL/uL (ref 3.80–5.20)
RDW: 13.8 % (ref 11.5–14.5)
WBC: 12.5 10*3/uL — ABNORMAL HIGH (ref 3.6–11.0)

## 2015-06-17 LAB — BASIC METABOLIC PANEL
ANION GAP: 6 (ref 5–15)
BUN: 19 mg/dL (ref 6–20)
CALCIUM: 8.8 mg/dL — AB (ref 8.9–10.3)
CHLORIDE: 105 mmol/L (ref 101–111)
CO2: 26 mmol/L (ref 22–32)
Creatinine, Ser: 0.63 mg/dL (ref 0.44–1.00)
GFR calc non Af Amer: >60 mL/min (ref >60)
GLUCOSE: 167 mg/dL — AB (ref 65–99)
POTASSIUM: 3.9 mmol/L (ref 3.5–5.1)
Sodium: 137 mmol/L (ref 135–145)

## 2015-06-17 LAB — HEMOGLOBIN A1C: HEMOGLOBIN A1C: 6.2 % — AB (ref 4.0–6.0)

## 2015-06-17 LAB — GLUCOSE, CAPILLARY
GLUCOSE-CAPILLARY: 169 mg/dL — AB (ref 65–99)
GLUCOSE-CAPILLARY: 173 mg/dL — AB (ref 65–99)
GLUCOSE-CAPILLARY: 182 mg/dL — AB (ref 65–99)
Glucose-Capillary: 174 mg/dL — ABNORMAL HIGH (ref 65–99)
Glucose-Capillary: 172 mg/dL — ABNORMAL HIGH (ref 65–99)

## 2015-06-17 MED ORDER — ENOXAPARIN SODIUM 40 MG/0.4ML ~~LOC~~ SOLN
40.0000 mg | Freq: Two times a day (BID) | SUBCUTANEOUS | Status: DC
  Administered 2015-06-17 – 2015-06-18 (×3): 40 mg via SUBCUTANEOUS
  Filled 2015-06-17 (×3): qty 0.4

## 2015-06-17 NOTE — Progress Notes (Addendum)
55 y/o F on Lovenox 40 mg daily for DVT prophylaxis.   BMI= 41, CrCl= 86 ml/min  Will increase Lovenox to 40 mg bid for BMI > 40.   Ulice Dash, PharmD Clinical Pharmacist

## 2015-06-17 NOTE — Progress Notes (Signed)
[facility] [place] NAME: Heather Nguyen    MR#:  [id number]  DATE OF BIRTH:  1960-10-16  SUBJECTIVE:  CHIEF COMPLAINT:   Chief Complaint  Patient presents with  . Shortness of Breath   Better SOB, cough and wheezing. On O2 [place] 4L. REVIEW OF SYSTEMS:  CONSTITUTIONAL: No fever, fatigue or weakness.  EYES: No blurred or double vision.  EARS, NOSE, AND THROAT: No tinnitus or ear pain.  RESPIRATORY: hascough, shortness of breath, wheezing but no hemoptysis.  CARDIOVASCULAR: No chest pain, orthopnea, edema.  GASTROINTESTINAL: No nausea, vomiting, diarrhea or abdominal pain.  GENITOURINARY: No dysuria, hematuria.  ENDOCRINE: No polyuria, nocturia,  HEMATOLOGY: No anemia, easy bruising or bleeding SKIN: No rash or lesion. MUSCULOSKELETAL: No joint pain or arthritis.   NEUROLOGIC: No tingling, numbness, weakness.  PSYCHIATRY: No anxiety or depression.   DRUG ALLERGIES:   Allergies  Allergen Reactions  . Codeine Anaphylaxis    VITALS:  Blood pressure 139/67, pulse 95, temperature 97.9 F (36.6 C), temperature source Oral, resp. rate 18, height 5\' 1"  (1.549 m), weight 99.791 kg (220 lb), SpO2 90 %.  PHYSICAL EXAMINATION:  GENERAL:  55 y.o.-year-old patient lying in the bed with no acute distress. Morbid obese. EYES: Pupils equal, round, reactive to light and accommodation. No scleral icterus. Extraocular muscles intact.  HEENT: Head atraumatic, normocephalic. Oropharynx and nasopharynx clear.  NECK:  Supple, no jugular venous distention. No thyroid enlargement, no tenderness.  LUNGS: Normal breath sounds bilaterally, moderate expiratory wheezing,  No rales,rhonchi or crepitation. No use of accessory muscles of respiration.  CARDIOVASCULAR: S1, S2 normal. No murmurs, rubs, or gallops.  ABDOMEN: Soft, nontender, nondistended. Bowel sounds present. No organomegaly or mass.  EXTREMITIES: No pedal edema, cyanosis, or clubbing.   NEUROLOGIC: Cranial nerves II through XII are intact. Muscle strength 5/5 in all extremities. Sensation intact. Gait not checked.  PSYCHIATRIC: The patient is alert and oriented x 3.  SKIN: No obvious rash, lesion, or ulcer.    LABORATORY PANEL:   CBC  Recent Labs Lab 06/17/15 0616  WBC 12.5*  HGB 14.5  HCT 43.5  PLT 161   ------------------------------------------------------------------------------------------------------------------  Chemistries   Recent Labs Lab 06/14/15 1955  06/17/15 0616  NA 134*  < > 137  K 3.6  < > 3.9  CL 104  < > 105  CO2 21*  < > 26  GLUCOSE 126*  < > 167*  BUN 15  < > 19  CREATININE 0.64  < > 0.63  CALCIUM 8.9  < > 8.8*  AST 20  --   --   ALT 24  --   --   ALKPHOS 75  --   --   BILITOT 0.8  --   --   < > = values in this interval not displayed. ------------------------------------------------------------------------------------------------------------------  Cardiac Enzymes  Recent Labs Lab 06/16/15 0955  TROPONINI <0.03   ------------------------------------------------------------------------------------------------------------------  RADIOLOGY:  Dg Chest Port 1 View  06/16/2015  CLINICAL DATA:  Shortness of breath.  Smoker. EXAM: PORTABLE CHEST 1 VIEW COMPARISON:  06/14/2015 FINDINGS: The cardiomediastinal silhouette is within normal limits. Chronic interstitial changes are similar to the prior study and most conspicuous in the lung bases. No definite acute airspace consolidation, overt pulmonary edema, pleural effusion, or pneumothorax is identified. No acute osseous abnormality is seen. IMPRESSION: Similar appearance of chronic bronchitic/interstitial changes. Electronically Signed   By: Logan Bores M.D.   On: 06/16/2015 10:25    EKG:  Orders placed or performed during the hospital encounter of 06/16/15  . EKG 12-Lead  . EKG 12-Lead    ASSESSMENT AND PLAN:   Heather Nguyen is a 55 y.o. female with a known history  of asthma , hypertension, not on home oxygen and ongoing smoking present to the hospital secondary to worsening shortness of breath.  #1 ACUTE HYPOXIC RESPIRATORY FAILURE- secondary to asthma exacerbation with acute bronchits  She is off BiPAP, on O2 [place] 4 L. Try to wean off O2 [place]. Taper IV steroids, continue DuoNeb's, pulmicort and Levaquin for bronchitis  #2 HTN- cont lisinopril  #3 Tobacco use disorder- counseled for 12min, started on nicotine patch   * mild elevated BS. Check HbA1C, continue SL. * Morbid obese.  All the records are reviewed and case discussed with Care Management/Social Workerr. Management plans discussed with the patient, family and they are in agreement.  CODE STATUS:full code.  TOTAL TIME TAKING CARE OF THIS PATIENT: 37 minutes.  Greater than 50% time was spent on coordination of care and face-to-face counseling.  POSSIBLE D/C IN 2 DAYS, DEPENDING ON CLINICAL CONDITION.   Demetrios Loll M.D on 06/17/2015 at 2:34 PM  Between 7am to 6pm - Pager - [phone]  After 6pm go to [email or web address] - password [email or web address] [facility]  Office  [phone]  CC: Primary care physician; Sallee Lange, NP

## 2015-06-17 NOTE — Consult Note (Signed)
PCCM FOLLOW UP PROGRESS NOTE  Requesting MD/Service: Kalisetti/[facility] Date of initial consultation: 06/16/15 Reason for consultation: Acute asthmatic bronchitis  HPI:  73 F smoker with hx of asthma first diagnosed at age 55 with persistent bronchitis symptoms X one month, seen in ED 04/12 and diagnosed as asthma exacerbation. Discharged to home with prednisone. Presented again to ED on the day of this admission with subjective fever and much increased SOB. Was in extremis in ED and placed on BiPAP. Much improved by time of arrival to ICU/SDU after nebulized bronchodilators. Cognition is intact. At her baseline, she is maintained on Asmanex and PRN albuterol which she uses 3-4 times per week. She smokes 1/2 ppd and exhibits little insight into the relationship between smoking and her respiratory problems.  SUBJ: Overall much improved. No new complaints  Filed Vitals:   06/17/15 0700 06/17/15 0757 06/17/15 0800 06/17/15 0912  BP: 150/72  140/80 139/67  Pulse: 79  70 95  Temp: 98 F (36.7 C)   97.9 F (36.6 C)  TempSrc:    Oral  Resp: 27  14 18   Height:      Weight:      SpO2: 94% 98% 97% 90%     EXAM:  Gen: Obese, NAD HEENT: NCAT Lungs: scattered wheezes Cardiovascular: Reg, no murmurs noted Abdomen: Obese, soft, nontender, normal BS Ext: without clubbing, cyanosis, edema Neuro: nonfocal  DATA:   BMP Latest Ref Rng 06/17/2015 06/16/2015 06/14/2015  Glucose 65 - 99 mg/dL 167(H) 175(H) 126(H)  BUN 6 - 20 mg/dL 19 22(H) 15  Creatinine 0.44 - 1.00 mg/dL 0.63 0.78 0.64  Sodium 135 - 145 mmol/L 137 135 134(L)  Potassium 3.5 - 5.1 mmol/L 3.9 3.9 3.6  Chloride 101 - 111 mmol/L 105 105 104  CO2 22 - 32 mmol/L 26 22 21(L)  Calcium 8.9 - 10.3 mg/dL 8.8(L) 8.9 8.9    CBC Latest Ref Rng 06/17/2015 06/16/2015 06/14/2015  WBC 3.6 - 11.0 K/uL 12.5(H) 13.1(H) 9.8  Hemoglobin 12.0 - 16.0 g/dL 14.5 16.1(H) 16.4(H)  Hematocrit 35.0 - 47.0 % 43.5 48.4(H) 48.3(H)  Platelets 150 - 440 K/uL 161  180 162    CXR:  NNF  IMPRESSION:   1) Acute hypoxic respiratory failure 2) Acute asthmatic bronchitis  3) suspected COPD 4) smoker 5) steroid and stress induced hyperglycemia  PLAN:  Agree with transfer to medical floor Cont supplemental O2 as needed to maintain SpO2 > 92% Taper systemic steroids to off over 7-10 days Cont nebulized steroids and bronchodilators Complete 7 days levofloxacin Again counseled re: smoking cessation I have scheduled follow up with me in office in 2-3 weeks  Merton Border, MD PCCM service Mobile [phone] Pager [phone] 06/17/2015

## 2015-06-17 NOTE — Care Management Note (Addendum)
Case Management Note  Patient Details  Name: Heather Nguyen MRN: [id number] Date of Birth: [birth year]  Subjective/Objective:          55yo Mrs Heather Nguyen was admitted 06/16/15 with an Asthma exacerbation and acute Bronchitis. She resides at home with her husband. PCP=Sarah PA at the [facility] in [place]. PCP=Sarah Designer, multimedia PA at [facility] in [place]. Pharmacy= [facility]. No home equipment, no home oxygen, no current home health services. Case management will follow for discharge planning. May need home o2 and possibly a nebulizer machine.           Action/Plan:   Expected Discharge Date:                  Expected Discharge Plan:     In-House Referral:     Discharge planning Services     Post Acute Care Choice:    Choice offered to:     DME Arranged:    DME Agency:     HH Arranged:    [place] Agency:     Status of Service:     Medicare Important Message Given:    Date Medicare IM Given:    Medicare IM give by:    Date Additional Medicare IM Given:    Additional Medicare Important Message give by:     If discussed at [place] of Stay Meetings, dates discussed:    Additional Comments:  Aslin Farinas A, RN 06/17/2015, 11:24 AM

## 2015-06-17 NOTE — Progress Notes (Signed)
Inpatient Diabetes Program Recommendations  AACE/ADA: New Consensus Statement on Inpatient Glycemic Control (2015)  Target Ranges:  Prepandial:   less than 140 mg/dL      Peak postprandial:   less than 180 mg/dL (1-2 hours)      Critically ill patients:  140 - 180 mg/dL   Review of Glycemic Control Results for LEMOYNE, MCMATH (MRN [id number]) as of 06/17/2015 12:01  Ref. Range 06/16/2015 18:31 06/16/2015 19:53 06/16/2015 21:57 06/17/2015 00:18 06/17/2015 07:33  Glucose-Capillary Latest Ref Range: 65-99 mg/dL 262 (H) 230 (H) 192 (H) 169 (H) 174 (H)    Diabetes history: None noted Outpatient Diabetes medications: none Current orders for Inpatient glycemic control: Novolog 0-9 tid with meals, Novolog 0-5 units qhs * prednisone q12h  Inpatient Diabetes Program Recommendations: Steroid induced hyperglycemia. Do you want to add a carb modified diet since blood sugars are elevated ? Consider ordering an A1C.  Gentry Fitz, RN, BA, MHA, CDE Diabetes Coordinator Inpatient Diabetes Program  [phone] (Team Pager) [phone] ([place]) 06/17/2015 12:03 PM

## 2015-06-17 NOTE — Progress Notes (Signed)
Patient alert and oriented. No complaints of pain or shortness of breath. Tolerating diet and using bedpan with no complications. Patient is on 4 liters. Patient moving to floor.

## 2015-06-17 NOTE — Care Management Note (Deleted)
Case Management Note  Patient Details  Name: Heather Nguyen MRN: [id number] Date of Birth: 1960-12-23  Subjective/Objective:                    Action/Plan:   Expected Discharge Date:                  Expected Discharge Plan:     In-House Referral:     Discharge planning Services     Post Acute Care Choice:    Choice offered to:     DME Arranged:    DME Agency:     HH Arranged:    [place] Agency:     Status of Service:     Medicare Important Message Given:    Date Medicare IM Given:    Medicare IM give by:    Date Additional Medicare IM Given:    Additional Medicare Important Message give by:     If discussed at [place] of Stay Meetings, dates discussed:    Additional Comments:  Gia Lusher A, RN 06/17/2015, 11:31 AM

## 2015-06-17 NOTE — Progress Notes (Signed)
Bipap is not in pt's room. Pt is no distress. Pt doesn't wish to use the Bipap

## 2015-06-18 LAB — GLUCOSE, CAPILLARY
Glucose-Capillary: 164 mg/dL — ABNORMAL HIGH (ref 65–99)
Glucose-Capillary: 199 mg/dL — ABNORMAL HIGH (ref 65–99)

## 2015-06-18 MED ORDER — PREDNISONE 10 MG PO TABS: ORAL_TABLET | ORAL | Status: DC

## 2015-06-18 MED ORDER — FLUTICASONE-SALMETEROL 250-50 MCG/DOSE IN AEPB: 1.0000 | INHALATION_SPRAY | Freq: Two times a day (BID) | RESPIRATORY_TRACT | Status: DC

## 2015-06-18 MED ORDER — LEVOFLOXACIN 500 MG PO TABS: 500.0000 mg | ORAL_TABLET | Freq: Every day | ORAL | Status: DC

## 2015-06-18 NOTE — Discharge Instructions (Signed)
Heart healthy and ADA diet. Activity as tolerated. Smoking cessation. Home O2 [place] 2L.

## 2015-06-18 NOTE — Care Management Note (Signed)
Case Management Note  Patient Details  Name: Heather Nguyen MRN: [id number] Date of Birth: 12-Sep-1960  Subjective/Objective:      A request for new home oxygen was called to [facility] at [place]. [employer] will deliver a portable oxygen tank to [name] in room 144 today and also set up her home oxygen.               Action/Plan:   Expected Discharge Date:                  Expected Discharge Plan:     In-House Referral:     Discharge planning Services     Post Acute Care Choice:    Choice offered to:     DME Arranged:    DME Agency:     HH Arranged:    [place] Agency:     Status of Service:     Medicare Important Message Given:    Date Medicare IM Given:    Medicare IM give by:    Date Additional Medicare IM Given:    Additional Medicare Important Message give by:     If discussed at [place] of Stay Meetings, dates discussed:    Additional Comments:  Jermal Dismuke A, RN 06/18/2015, 10:54 AM

## 2015-06-18 NOTE — Progress Notes (Signed)
Discharge note: Patient belongings and Rxs given upon discharge along with excused note for employer. O2 delivered prior to discharge. Escorted ouit of facility by staff.

## 2015-06-18 NOTE — Discharge Summary (Signed)
[facility] at [place] NAME: Heather Nguyen    MR#:  [id number]  DATE OF BIRTH:  06-24-1960  DATE OF ADMISSION:  06/16/2015 ADMITTING PHYSICIAN: Gladstone Lighter, MD  DATE OF DISCHARGE: 06/18/2015 PRIMARY CARE PHYSICIAN: Sallee Lange, NP    ADMISSION DIAGNOSIS:  Hypoxia [R09.02] Asthma with status asthmaticus, moderate persistent [J45.42]   DISCHARGE DIAGNOSIS:  ACUTE HYPOXIC RESPIRATORY FAILURE- secondary to asthma exacerbation with acute bronchits  SECONDARY DIAGNOSIS:   Past Medical History  Diagnosis Date  . Hypertension   . Asthma     HOSPITAL COURSE:   Heather Nguyen is a 55 y.o. female with a known history of asthma , hypertension, not on home oxygen and ongoing smoking present to the hospital secondary to worsening shortness of breath.  #1 ACUTE HYPOXIC RESPIRATORY FAILURE- secondary to asthma exacerbation with acute bronchits  She is off BiPAP, on O2 [place] 4 L. Try to wean off O2 [place] but SAT is down to 84% without O2NC. She needs home O2 [place] 2L. She was treated with IV steroids, DuoNeb's, pulmicort and Levaquin for bronchitis. Change to prednisone taper and  Continue levaquin 7 days per Dr. Leonidas Romberg.  #2 HTN- cont lisinopril  #3 Tobacco use disorder- counseled for 64min, started on nicotine patch   * DM2. Check HbA1C 6.2, on SL. * Morbid obese.  DISCHARGE CONDITIONS:   Stable, discharge home today.  CONSULTS OBTAINED:  Treatment Team:  Wilhelmina Mcardle, MD  DRUG ALLERGIES:   Allergies  Allergen Reactions  . Codeine Anaphylaxis    DISCHARGE MEDICATIONS:   Current Discharge Medication List    START taking these medications   Details  Fluticasone-Salmeterol (ADVAIR DISKUS) 250-50 MCG/DOSE AEPB Inhale 1 puff into the lungs 2 (two) times daily. Qty: 60 each, Refills: 0    levofloxacin (LEVAQUIN) 500 MG tablet Take 1 tablet (500 mg total) by mouth daily. Qty: 5 tablet, Refills: 0       CONTINUE these medications which have CHANGED   Details  predniSONE (DELTASONE) 10 MG tablet 40 mg po daily for 3 days, 20 mg po daily for 3 days, 10 mg po daily for 3 days. Qty: 21 tablet, Refills: 0      CONTINUE these medications which have NOT CHANGED   Details  albuterol (PROVENTIL HFA;VENTOLIN HFA) 108 (90 Base) MCG/ACT inhaler Inhale 2 puffs into the lungs every 6 (six) hours as needed for wheezing or shortness of breath.    ibuprofen (ADVIL,MOTRIN) 200 MG tablet Take 400 mg by mouth every 6 (six) hours as needed for headache or mild pain.    lisinopril (PRINIVIL,ZESTRIL) 5 MG tablet Take 5 mg by mouth daily.      STOP taking these medications     Mometasone Furoate (ASMANEX 30 METERED DOSES) 110 MCG/INH AEPB          DISCHARGE INSTRUCTIONS:    If you experience worsening of your admission symptoms, develop shortness of breath, life threatening emergency, suicidal or homicidal thoughts you must seek medical attention immediately by calling 911 or calling your MD immediately  if symptoms less severe.  You Must read complete instructions/literature along with all the possible adverse reactions/side effects for all the Medicines you take and that have been prescribed to you. Take any new Medicines after you have completely understood and accept all the possible adverse reactions/side effects.   Please note  You were cared for by a hospitalist during your hospital stay. If you have any  questions about your discharge medications or the care you received while you were in the hospital after you are discharged, you can call the unit and asked to speak with the hospitalist on call if the hospitalist that took care of you is not available. Once you are discharged, your primary care physician will handle any further medical issues. Please note that NO REFILLS for any discharge medications will be authorized once you are discharged, as it is imperative that you return to your primary  care physician (or establish a relationship with a primary care physician if you do not have one) for your aftercare needs so that they can reassess your need for medications and monitor your lab values.    Today   SUBJECTIVE   Better SOB, no wheezing.   VITAL SIGNS:  Blood pressure 138/92, pulse 72, temperature 97.6 F (36.4 C), temperature source Oral, resp. rate 18, height 5\' 1"  (1.549 m), weight 99.791 kg (220 lb), SpO2 90 %.  I/O:   Intake/Output Summary (Last 24 hours) at 06/18/15 1332 Last data filed at 06/18/15 0900  Gross per 24 hour  Intake    580 ml  Output      0 ml  Net    580 ml    PHYSICAL EXAMINATION:  GENERAL:  55 y.o.-year-old patient lying in the bed with no acute distress. Morbid obese. EYES: Pupils equal, round, reactive to light and accommodation. No scleral icterus. Extraocular muscles intact.  HEENT: Head atraumatic, normocephalic. Oropharynx and nasopharynx clear.  NECK:  Supple, no jugular venous distention. No thyroid enlargement, no tenderness.  LUNGS: Normal breath sounds bilaterally, mild expiratory wheezing, no rales,rhonchi or crepitation. No use of accessory muscles of respiration.  CARDIOVASCULAR: S1, S2 normal. No murmurs, rubs, or gallops.  ABDOMEN: Soft, non-tender, non-distended. Bowel sounds present. No organomegaly or mass.  EXTREMITIES: No pedal edema, cyanosis, or clubbing.  NEUROLOGIC: Cranial nerves II through XII are intact. Muscle strength 5/5 in all extremities. Sensation intact. Gait not checked.  PSYCHIATRIC: The patient is alert and oriented x 3.  SKIN: No obvious rash, lesion, or ulcer.   DATA REVIEW:   CBC  Recent Labs Lab 06/17/15 0616  WBC 12.5*  HGB 14.5  HCT 43.5  PLT 161    Chemistries   Recent Labs Lab 06/14/15 1955  06/17/15 0616  NA 134*  < > 137  K 3.6  < > 3.9  CL 104  < > 105  CO2 21*  < > 26  GLUCOSE 126*  < > 167*  BUN 15  < > 19  CREATININE 0.64  < > 0.63  CALCIUM 8.9  < > 8.8*  AST 20   --   --   ALT 24  --   --   ALKPHOS 75  --   --   BILITOT 0.8  --   --   < > = values in this interval not displayed.  Cardiac Enzymes  Recent Labs Lab 06/16/15 0955  TROPONINI <0.03    Microbiology Results  Results for orders placed or performed during the hospital encounter of 06/16/15  MRSA PCR Screening     Status: None   Collection Time: 06/16/15  2:55 PM  Result Value Ref Range Status   MRSA by PCR NEGATIVE NEGATIVE Final    Comment:        The GeneXpert MRSA Assay (FDA approved for NASAL specimens only), is one component of a comprehensive MRSA colonization surveillance program. It is not intended to  diagnose MRSA infection nor to guide or monitor treatment for MRSA infections.     RADIOLOGY:  No results found.      Management plans discussed with the patient, family and they are in agreement.  CODE STATUS:     Code Status Orders        Start     Ordered   06/16/15 1434  Full code   Continuous     06/16/15 1434    Code Status History    Date Active Date Inactive Code Status Order ID Comments User Context   This patient has a current code status but no historical code status.      TOTAL TIME TAKING CARE OF THIS PATIENT: 36 minutes.    Demetrios Loll M.D on 06/18/2015 at 1:32 PM  Between 7am to 6pm - Pager - [phone]  After 6pm go to [email or web address] - password [email or web address] [facility]  Office  [phone]  CC: Primary care physician; Sallee Lange, NP

## 2015-06-18 NOTE — Progress Notes (Addendum)
Ambulated patient without oxygent x1 around nursing station, O2 ranging 84-86%, HR 100-110s. Upon standing O2 level dropped to 80%. Currently receiving  1L O2 via nasal cannula. MD notified, orders to reset O2 to 2L via nasal cannula to maintain ranging 90-97%.

## 2015-06-18 NOTE — Progress Notes (Signed)
[facility] at [place] was admitted to the Hospital on 06/16/2015 and Discharged  06/18/2015 and should be excused from work/school until she see her PCP or pulmonary doctor to get recommendation.  Demetrios Loll MD.  Demetrios Loll M.D on 06/18/2015,at 10:48 AM  [place] [facility]  [phone]

## 2015-06-20 ENCOUNTER — Telehealth: Payer: Self-pay | Admitting: *Deleted

## 2015-06-20 NOTE — Telephone Encounter (Signed)
appt scheduled.  Nothing further needed.  

## 2015-06-20 NOTE — Telephone Encounter (Signed)
-----   Message from Wilhelmina Mcardle, MD sent at 06/17/2015  1:07 PM EDT ----- Please schedule F/U with me in 2-3 weeks  Thanks  Waunita Schooner

## 2015-06-21 LAB — GLUCOSE, CAPILLARY: Glucose-Capillary: 217 mg/dL — ABNORMAL HIGH (ref 65–99)

## 2015-07-07 ENCOUNTER — Ambulatory Visit (INDEPENDENT_AMBULATORY_CARE_PROVIDER_SITE_OTHER): Payer: Commercial Managed Care - HMO | Admitting: Pulmonary Disease

## 2015-07-07 ENCOUNTER — Encounter: Payer: Self-pay | Admitting: Pulmonary Disease

## 2015-07-07 VITALS — BP 136/74 | HR 89 | Ht 61.0 in | Wt 240.0 lb

## 2015-07-07 DIAGNOSIS — F17200 Nicotine dependence, unspecified, uncomplicated: Secondary | ICD-10-CM

## 2015-07-07 DIAGNOSIS — Z720 Tobacco use: Secondary | ICD-10-CM | POA: Diagnosis not present

## 2015-07-07 DIAGNOSIS — J449 Chronic obstructive pulmonary disease, unspecified: Secondary | ICD-10-CM

## 2015-07-07 DIAGNOSIS — J4540 Moderate persistent asthma, uncomplicated: Secondary | ICD-10-CM

## 2015-07-10 NOTE — Progress Notes (Signed)
POST HOSPITALIZATION FOLLOW UP  PROBLEMS: Lifelong asthma Smoker - abstinent since 06/13/15 S/P hospitalization 04/13 - 06/18/15  INTERVAL HISTORY: No major events.   SUBJ: I met her during recent hospitalization. She has asthma and suspected COPD. Was still smoking at the time of her hospitalization. She was discharged 04/15 on pred taper and course of levofloxacin. Has completed both of these. Reports that her respiratory status is back to baseline. She is maintained on Advair and rarely uses rescue MDI. Has not smoked since discharge. Denies CP, fever, purulent sputum, hemoptysis and calf tenderness. Has chronic pedal edema.   OBJ: Filed Vitals:   07/07/15 1139  BP: 136/74  Pulse: 89  Height: 5' 1"  (1.549 m)  Weight: 240 lb (108.863 kg)  SpO2: 94%   NAD HEENT WNL BS full without wheezes RRR s M Obese, soft, + BS Trace symmetric pedal edema   DATA: Prior CXRs reviewed. No prior PFTs available  IMPRESSION: Chronic obstructive pulmonary disease, unspecified COPD type ([place]) - Plan: DG Chest 2 View, Pulmonary function test  Asthma, moderate persistent, uncomplicated  Smoker    PLAN: I congratulated her on her smoking cessation success so far and we discussed strategies to remain abstinent Continue Advair and PRN albuterol ROV 3 months with CXR and PFTs   Merton Border, MD PCCM service Mobile [phone] Pager [phone] 07/10/2015

## 2015-09-07 ENCOUNTER — Other Ambulatory Visit: Payer: Self-pay | Admitting: Nurse Practitioner

## 2015-09-07 DIAGNOSIS — Z1231 Encounter for screening mammogram for malignant neoplasm of breast: Secondary | ICD-10-CM

## 2015-09-21 ENCOUNTER — Ambulatory Visit
Admission: RE | Admit: 2015-09-21 | Discharge: 2015-09-21 | Disposition: A | Discharge status: Home / Self Care | Payer: Commercial Managed Care - HMO | Source: Ambulatory Visit | Attending: Nurse Practitioner | Admitting: Nurse Practitioner

## 2015-09-21 ENCOUNTER — Ambulatory Visit: Payer: 59

## 2015-09-21 DIAGNOSIS — Z1231 Encounter for screening mammogram for malignant neoplasm of breast: Secondary | ICD-10-CM | POA: Insufficient documentation

## 2015-12-19 ENCOUNTER — Encounter: Payer: Self-pay | Admitting: Emergency Medicine

## 2015-12-19 ENCOUNTER — Emergency Department: Payer: Commercial Managed Care - HMO

## 2015-12-19 ENCOUNTER — Emergency Department
Admission: EM | Admit: 2015-12-19 | Discharge: 2015-12-19 | Disposition: A | Discharge status: Home / Self Care | Payer: Commercial Managed Care - HMO | Attending: Emergency Medicine | Admitting: Emergency Medicine

## 2015-12-19 DIAGNOSIS — J45901 Unspecified asthma with (acute) exacerbation: Secondary | ICD-10-CM | POA: Insufficient documentation

## 2015-12-19 DIAGNOSIS — R05 Cough: Secondary | ICD-10-CM | POA: Diagnosis present

## 2015-12-19 DIAGNOSIS — I10 Essential (primary) hypertension: Secondary | ICD-10-CM | POA: Diagnosis not present

## 2015-12-19 DIAGNOSIS — Z791 Long term (current) use of non-steroidal anti-inflammatories (NSAID): Secondary | ICD-10-CM | POA: Insufficient documentation

## 2015-12-19 DIAGNOSIS — Z7951 Long term (current) use of inhaled steroids: Secondary | ICD-10-CM | POA: Diagnosis not present

## 2015-12-19 DIAGNOSIS — J189 Pneumonia, unspecified organism: Secondary | ICD-10-CM | POA: Insufficient documentation

## 2015-12-19 DIAGNOSIS — Z79899 Other long term (current) drug therapy: Secondary | ICD-10-CM | POA: Diagnosis not present

## 2015-12-19 DIAGNOSIS — Z87891 Personal history of nicotine dependence: Secondary | ICD-10-CM | POA: Diagnosis not present

## 2015-12-19 DIAGNOSIS — J181 Lobar pneumonia, unspecified organism: Secondary | ICD-10-CM

## 2015-12-19 MED ORDER — PSEUDOEPH-BROMPHEN-DM 30-2-10 MG/5ML PO SYRP: 5.0000 mL | ORAL_SOLUTION | Freq: Four times a day (QID) | ORAL | 0 refills | Status: DC | PRN

## 2015-12-19 MED ORDER — AZITHROMYCIN 250 MG PO TABS: ORAL_TABLET | ORAL | 0 refills | Status: AC

## 2015-12-19 NOTE — ED Provider Notes (Signed)
[facility] Emergency Department Provider Note   ____________________________________________   None    (approximate)  I have reviewed the triage vital signs and the nursing notes.   HISTORY  Chief Complaint Nasal Congestion and Cough    HPI Heather Nguyen is a 55 y.o. female patient complaining of cough with nasal and chest congestion. Patient also complaining of right flank pain. Patient saw her PCP 2 weeks ago and was given antibiotics with improvement of symptoms until cough returned a few days ago. Patient denies any urinary complaints. Patient denies any fever associated this complaint.Patient denies any pain associated with this complaint.   Past Medical History:  Diagnosis Date  . Asthma   . Hypertension     Patient Active Problem List   Diagnosis Date Noted  . Asthma exacerbation 06/16/2015    Past Surgical History:  Procedure Laterality Date  . 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 Authorizing Provider  albuterol (PROVENTIL HFA;VENTOLIN HFA) 108 (90 Base) MCG/ACT inhaler Inhale 2 puffs into the lungs every 6 (six) hours as needed for wheezing or shortness of breath.    Historical Provider, MD  azithromycin (ZITHROMAX Z-PAK) 250 MG tablet Take 2 tablets (500 mg) on  Day 1,  followed by 1 tablet (250 mg) once daily on Days 2 through 5. 12/19/15 12/24/15  Sable Feil, PA-C  brompheniramine-pseudoephedrine-DM 30-2-10 MG/5ML syrup Take 5 mLs by mouth 4 (four) times daily as needed. 12/19/15   Sable Feil, PA-C  Fluticasone-Salmeterol (ADVAIR DISKUS) 250-50 MCG/DOSE AEPB Inhale 1 puff into the lungs 2 (two) times daily. 06/18/15   Demetrios Loll, MD  ibuprofen (ADVIL,MOTRIN) 200 MG tablet Take 400 mg by mouth every 6 (six) hours as needed for headache or mild pain. Reported on 07/07/2015    Historical Provider, MD  lisinopril (PRINIVIL,ZESTRIL) 5  MG tablet Take 5 mg by mouth daily.    Historical Provider, MD    Allergies Codeine  Family History  Problem Relation Age of Onset  . CAD Father   . Asthma Mother   . Breast cancer Neg Hx     Social History Social History  Substance Use Topics  . Smoking status: Former Smoker    Packs/day: 0.25    Years: 35.00    Types: Cigarettes    Quit date: 06/13/2015  . Smokeless tobacco: Never Used  . Alcohol use No    Review of Systems Constitutional: No fever/chills Eyes: No visual changes. ENT: No sore throat. Cardiovascular: Denies chest pain. Respiratory: Denies shortness of breath.Productive cough and mild wheezing. Gastrointestinal: No abdominal pain.  No nausea, no vomiting.  No diarrhea.  No constipation. Genitourinary: Negative for dysuria. Musculoskeletal:Right flank pain  Skin: Negative for rash. Neurological: Negative for headaches, focal weakness or numbness. Endocrine:Hypertension Hematological/Lymphatic:  Allergic/Immunilogical: Codeine ____________________________________________   PHYSICAL EXAM:  VITAL SIGNS: ED Triage Vitals [12/19/15 0716]  Enc Vitals Group     BP (!) 185/88     Pulse Rate 84     Resp 18     Temp 98.6 F (37 C)     Temp Source Oral     SpO2 95 %     Weight 240 lb (108.9 kg)     Height 5\' 1"  (1.549 m)     Head Circumference      Peak Flow      Pain Score  Pain Loc      Pain Edu?      Excl. in [place]?     Constitutional: Alert and oriented. Well appearing and in no acute distress. Eyes: Conjunctivae are normal. PERRL. EOMI. Head: Atraumatic. Nose: No congestion/rhinnorhea. Mouth/Throat: Mucous membranes are moist.  Oropharynx non-erythematous. Neck: No stridor. *No cervical spine tenderness to palpation. Hematological/Lymphatic/Immunilogical: No cervical lymphadenopathy. Cardiovascular: Normal rate, regular rhythm. Grossly normal heart sounds.  Good peripheral circulation. Elevated blood pressure. Patient states she took  her blood pressure medicine prior to arrival.Will retake before departure Respiratory: Normal respiratory effort.  No retractions. Lungs mild wheezing. Productive cough Gastrointestinal: Soft and nontender. No distention. No abdominal bruits. No CVA tenderness. Musculoskeletal: No lower extremity tenderness nor edema.  No joint effusions. No obvious spinal deformity. No guarding palpation of the spinal processes. Patient has some moderate guarding palpation lower right paraspinal muscle area. Patient has a negative straight leg test. Patient has normal gait. Neurologic:  Normal speech and language. No gross focal neurologic deficits are appreciated. No gait instability. Skin:  Skin is warm, dry and intact. No rash noted. Psychiatric: Mood and affect are normal. Speech and behavior are normal.  ____________________________________________   LABS (all labs ordered are listed, but only abnormal results are displayed)  Labs Reviewed - No data to display ____________________________________________  EKG   ____________________________________________  RADIOLOGY  X-rays reveal minimal infiltrate left lower lobe. ____________________________________________   PROCEDURES  Procedure(s) performed: None  Procedures  Critical Care performed: No  ____________________________________________   INITIAL IMPRESSION / ASSESSMENT AND PLAN / ED COURSE  Pertinent labs & imaging results that were available during my care of the patient were reviewed by me and considered in my medical decision making (see chart for details). Left lower lobe infiltrate consistent with mild pneumonia. Patient given discharge Instructions. Patient blood pressure was taken before departure has now decreased to 143/64. Patient given a prescription for Zithromax and [employer] felt DM. Patient given a work note for 2 days. Patient advised to follow-up family doctor condition  persists.   ____________________________________________   FINAL CLINICAL IMPRESSION(S) / ED DIAGNOSES  Final diagnoses:  Community acquired pneumonia of left lower lobe of lung ([place])      NEW MEDICATIONS STARTED DURING THIS VISIT:  New Prescriptions   AZITHROMYCIN (ZITHROMAX Z-PAK) 250 MG TABLET    Take 2 tablets (500 mg) on  Day 1,  followed by 1 tablet (250 mg) once daily on Days 2 through 5.   BROMPHENIRAMINE-PSEUDOEPHEDRINE-DM 30-2-10 MG/5ML SYRUP    Take 5 mLs by mouth 4 (four) times daily as needed.     Note:  This document was prepared using Dragon voice recognition software and may include unintentional dictation errors.    Sable Feil, PA-C 12/19/15 [id number]    Carrie Mew, MD 12/19/15 [phone]

## 2015-12-19 NOTE — ED Notes (Signed)
Nasal congestion and occasional prod cough for couple of weeks  Also having some right flank pain  Non radiating  Also denies any fever

## 2015-12-19 NOTE — ED Triage Notes (Signed)
Patient presents to the ED with cough/congestion x 2 days.  Patient reports similar symptoms 2 weeks ago and she went to her PCP who prescribed antibiotics which improved patient's symptoms until just a couple of days ago.  Patient is in no obvious distress at this time.  Ambulatory to triage.  Skin is warm and dry.

## 2016-03-14 DIAGNOSIS — Z79899 Other long term (current) drug therapy: Secondary | ICD-10-CM | POA: Diagnosis not present

## 2016-03-14 DIAGNOSIS — I10 Essential (primary) hypertension: Secondary | ICD-10-CM | POA: Diagnosis not present

## 2016-03-14 DIAGNOSIS — J449 Chronic obstructive pulmonary disease, unspecified: Secondary | ICD-10-CM | POA: Diagnosis not present

## 2016-06-14 ENCOUNTER — Emergency Department: Payer: Commercial Managed Care - HMO

## 2016-06-14 ENCOUNTER — Emergency Department
Admission: EM | Admit: 2016-06-14 | Discharge: 2016-06-14 | Disposition: A | Discharge status: Home / Self Care | Payer: Commercial Managed Care - HMO | Attending: Emergency Medicine | Admitting: Emergency Medicine

## 2016-06-14 ENCOUNTER — Encounter: Payer: Self-pay | Admitting: *Deleted

## 2016-06-14 DIAGNOSIS — J209 Acute bronchitis, unspecified: Secondary | ICD-10-CM | POA: Diagnosis not present

## 2016-06-14 DIAGNOSIS — I10 Essential (primary) hypertension: Secondary | ICD-10-CM | POA: Diagnosis not present

## 2016-06-14 DIAGNOSIS — Z87891 Personal history of nicotine dependence: Secondary | ICD-10-CM | POA: Insufficient documentation

## 2016-06-14 DIAGNOSIS — J45909 Unspecified asthma, uncomplicated: Secondary | ICD-10-CM | POA: Diagnosis not present

## 2016-06-14 DIAGNOSIS — Z79899 Other long term (current) drug therapy: Secondary | ICD-10-CM | POA: Diagnosis not present

## 2016-06-14 DIAGNOSIS — R05 Cough: Secondary | ICD-10-CM | POA: Diagnosis not present

## 2016-06-14 LAB — BASIC METABOLIC PANEL
Anion gap: 8 (ref 5–15)
BUN: 16 mg/dL (ref 6–20)
CALCIUM: 9.8 mg/dL (ref 8.9–10.3)
CO2: 30 mmol/L (ref 22–32)
Chloride: 103 mmol/L (ref 101–111)
Creatinine, Ser: 0.83 mg/dL (ref 0.44–1.00)
GFR calc Af Amer: >60 mL/min (ref >60)
Glucose, Bld: 139 mg/dL — ABNORMAL HIGH (ref 65–99)
Potassium: 4.7 mmol/L (ref 3.5–5.1)
Sodium: 141 mmol/L (ref 135–145)

## 2016-06-14 LAB — CBC
HCT: 49.5 % — ABNORMAL HIGH (ref 35.0–47.0)
Hemoglobin: 16.5 g/dL — ABNORMAL HIGH (ref 12.0–16.0)
MCH: 28.8 pg (ref 26.0–34.0)
MCHC: 33.3 g/dL (ref 32.0–36.0)
MCV: 86.6 fL (ref 80.0–100.0)
Platelets: 221 10*3/uL (ref 150–440)
RBC: 5.72 MIL/uL — ABNORMAL HIGH (ref 3.80–5.20)
RDW: 13.7 % (ref 11.5–14.5)
WBC: 11.1 10*3/uL — AB (ref 3.6–11.0)

## 2016-06-14 LAB — TROPONIN I

## 2016-06-14 MED ORDER — BENZONATATE 100 MG PO CAPS: 100.0000 mg | ORAL_CAPSULE | Freq: Four times a day (QID) | ORAL | 0 refills | Status: AC | PRN

## 2016-06-14 MED ORDER — PREDNISONE 20 MG PO TABS: 40.0000 mg | ORAL_TABLET | Freq: Every day | ORAL | 0 refills | Status: DC

## 2016-06-14 MED ORDER — GUAIFENESIN 100 MG/5ML PO SOLN: 5.0000 mL | ORAL | 0 refills | Status: DC | PRN

## 2016-06-14 MED ORDER — DOXYCYCLINE HYCLATE 100 MG PO CAPS: 100.0000 mg | ORAL_CAPSULE | Freq: Two times a day (BID) | ORAL | 0 refills | Status: DC

## 2016-06-14 NOTE — ED Triage Notes (Signed)
Pt has a productive cough.  States coughing up white phlegm.  No fever  Hx copd.  Recent pneumonia.  No chest pain.  Pt alert.

## 2016-06-14 NOTE — ED Notes (Signed)
Patient transported to X-ray 

## 2016-06-14 NOTE — ED Provider Notes (Signed)
[facility] Emergency Department Provider Note  ____________________________________________  Time seen: Approximately 6:47 PM  I have reviewed the triage vital signs and the nursing notes.   HISTORY  Chief Complaint Cough    HPI Heather Nguyen is a 56 y.o. female who complains of productive cough for the past week. No chest pain or shortness of breath. No fevers chills or sweats. No exertional symptoms. a history of COPD.She's been using her nebulizer at home which is helping.     Past Medical History:  Diagnosis Date  . Asthma   . Hypertension      Patient Active Problem List   Diagnosis Date Noted  . Asthma exacerbation 06/16/2015     Past Surgical History:  Procedure Laterality Date  . 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 Authorizing Provider  albuterol (PROVENTIL HFA;VENTOLIN HFA) 108 (90 Base) MCG/ACT inhaler Inhale 2 puffs into the lungs every 6 (six) hours as needed for wheezing or shortness of breath.    Historical Provider, MD  benzonatate (TESSALON PERLES) 100 MG capsule Take 1 capsule (100 mg total) by mouth every 6 (six) hours as needed for cough. 06/14/16 06/14/17  Carrie Mew, MD  brompheniramine-pseudoephedrine-DM 30-2-10 MG/5ML syrup Take 5 mLs by mouth 4 (four) times daily as needed. 12/19/15   Sable Feil, PA-C  doxycycline (VIBRAMYCIN) 100 MG capsule Take 1 capsule (100 mg total) by mouth 2 (two) times daily. 06/14/16   Carrie Mew, MD  Fluticasone-Salmeterol (ADVAIR DISKUS) 250-50 MCG/DOSE AEPB Inhale 1 puff into the lungs 2 (two) times daily. 06/18/15   Demetrios Loll, MD  guaiFENesin (ROBITUSSIN) 100 MG/5ML SOLN Take 5 mLs (100 mg total) by mouth every 4 (four) hours as needed for cough or to loosen phlegm. 06/14/16   Carrie Mew, MD  ibuprofen (ADVIL,MOTRIN) 200 MG tablet Take 400 mg by mouth every 6 (six) hours as  needed for headache or mild pain. Reported on 07/07/2015    Historical Provider, MD  lisinopril (PRINIVIL,ZESTRIL) 5 MG tablet Take 5 mg by mouth daily.    Historical Provider, MD  predniSONE (DELTASONE) 20 MG tablet Take 2 tablets (40 mg total) by mouth daily. 06/14/16   Carrie Mew, MD     Allergies Codeine   Family History  Problem Relation Age of Onset  . CAD Father   . Asthma Mother   . Breast cancer Neg Hx     Social History Social History  Substance Use Topics  . Smoking status: Former Smoker    Packs/day: 0.25    Years: 35.00    Types: Cigarettes    Quit date: 06/13/2015  . Smokeless tobacco: Never Used  . Alcohol use No    Review of Systems  Constitutional:   No fever or chills.  ENT:   No sore throat. No rhinorrhea. Cardiovascular:   No chest pain. Respiratory:   No dyspnea Positive cough. Gastrointestinal:   Negative for abdominal pain, vomiting and diarrhea.  Genitourinary:   Negative for dysuria or difficulty urinating. Musculoskeletal:   Negative for focal pain or swelling Neurological:   Negative for headaches 10-point ROS otherwise negative.  ____________________________________________   PHYSICAL EXAM:  VITAL SIGNS: ED Triage Vitals  Enc Vitals Group     BP 06/14/16 1702 (!) 151/75     Pulse Rate 06/14/16 1702 98     Resp 06/14/16 1702 (!) 22  Temp 06/14/16 1702 97.8 F (36.6 C)     Temp Source 06/14/16 1702 Oral     SpO2 06/14/16 1702 94 %     Weight 06/14/16 1704 230 lb (104.3 kg)     Height 06/14/16 1704 5\' 1"  (1.549 m)     Head Circumference --      Peak Flow --      Pain Score 06/14/16 1702 7     Pain Loc --      Pain Edu? --      Excl. in [place]? --     Vital signs reviewed, nursing assessments reviewed.   Constitutional:   Alert and oriented. Well appearing and in no distress. Eyes:   No scleral icterus. No conjunctival pallor. PERRL. EOMI.  No nystagmus. ENT   Head:   Normocephalic and atraumatic.   Nose:   No  congestion/rhinnorhea. No septal hematoma   Mouth/Throat:   MMM, no pharyngeal erythema. No peritonsillar mass.    Neck:   No stridor. No SubQ emphysema. No meningismus. Hematological/Lymphatic/Immunilogical:   No cervical lymphadenopathy. Cardiovascular:   RRR. Symmetric bilateral radial and DP pulses.  No murmurs.  Respiratory:   Normal respiratory effort without tachypnea nor retractions. Breath sounds Equal with faint expiratory wheezes diffusely. With coughing there is increased wheezing evident of bronchospasm. Gastrointestinal:   Soft and nontender. Non distended. There is no CVA tenderness.  No rebound, rigidity, or guarding. Genitourinary:   deferred Musculoskeletal:   Normal range of motion in all extremities. No joint effusions.  No lower extremity tenderness.  No edema. Neurologic:   Normal speech and language.  CN 2-10 normal. Motor grossly intact. No gross focal neurologic deficits are appreciated.  Skin:    Skin is warm, dry and intact. No rash noted.  No petechiae, purpura, or bullae.  ____________________________________________    LABS (pertinent positives/negatives) (all labs ordered are listed, but only abnormal results are displayed) Labs Reviewed  BASIC METABOLIC PANEL - Abnormal; Notable for the following:       Result Value   Glucose, Bld 139 (*)    All other components within normal limits  CBC - Abnormal; Notable for the following:    WBC 11.1 (*)    RBC 5.72 (*)    Hemoglobin 16.5 (*)    HCT 49.5 (*)    All other components within normal limits  TROPONIN I   ____________________________________________   EKG  Interpreted by me Normal sinus rhythm rate of 91, normal axis and intervals. Normal QRS ST segments or T waves  ____________________________________________    RADIOLOGY  Dg Chest 2 View  Result Date: 06/14/2016 CLINICAL DATA:  Productive cough x1 week.  Smoker. EXAM: CHEST  2 VIEW COMPARISON:  12/19/2015 FINDINGS: The heart size  and mediastinal contours are within normal limits. Bilateral mild increase in interstitial prominence is noted. Minimal subsegmental atelectasis and/or scarring at the left lung base laterally. No pulmonary consolidation, effusion or pneumothorax. Mild aortic atherosclerosis without aneurysm. The visualized skeletal structures are unremarkable. IMPRESSION: Chronic bronchitic change of the lungs. Mild aortic atherosclerosis. Electronically Signed   By: Ashley Royalty M.D.   On: 06/14/2016 18:35    ____________________________________________   PROCEDURES Procedures  ____________________________________________   INITIAL IMPRESSION / ASSESSMENT AND PLAN / ED COURSE  Pertinent labs & imaging results that were available during my care of the patient were reviewed by me and considered in my medical decision making (see chart for details).  Patient well appearing no acute distress, presents with  cough. Exam is consistent with acute bronchitis. No suspicion of pneumonia and pneumothorax PE dissection. We'll treat with doxycycline due to her underlying COPD. Tessalon, guaifenesin, prednisone. Continue nebs at home. Return precautions given.         ____________________________________________   FINAL CLINICAL IMPRESSION(S) / ED DIAGNOSES  Final diagnoses:  Acute bronchitis, unspecified organism      New Prescriptions   BENZONATATE (TESSALON PERLES) 100 MG CAPSULE    Take 1 capsule (100 mg total) by mouth every 6 (six) hours as needed for cough.   DOXYCYCLINE (VIBRAMYCIN) 100 MG CAPSULE    Take 1 capsule (100 mg total) by mouth 2 (two) times daily.   GUAIFENESIN (ROBITUSSIN) 100 MG/5ML SOLN    Take 5 mLs (100 mg total) by mouth every 4 (four) hours as needed for cough or to loosen phlegm.   PREDNISONE (DELTASONE) 20 MG TABLET    Take 2 tablets (40 mg total) by mouth daily.     Portions of this note were generated with dragon dictation software. Dictation errors may occur despite best  attempts at proofreading.    Carrie Mew, MD 06/14/16 [phone]

## 2016-09-11 ENCOUNTER — Other Ambulatory Visit: Payer: Self-pay | Admitting: Nurse Practitioner

## 2016-09-11 DIAGNOSIS — Z0000 Encounter for general adult medical examination without abnormal findings: Secondary | ICD-10-CM | POA: Diagnosis not present

## 2016-09-11 DIAGNOSIS — J449 Chronic obstructive pulmonary disease, unspecified: Secondary | ICD-10-CM | POA: Diagnosis not present

## 2016-09-11 DIAGNOSIS — Z1231 Encounter for screening mammogram for malignant neoplasm of breast: Secondary | ICD-10-CM

## 2016-09-11 DIAGNOSIS — I10 Essential (primary) hypertension: Secondary | ICD-10-CM | POA: Diagnosis not present

## 2016-09-11 DIAGNOSIS — Z79899 Other long term (current) drug therapy: Secondary | ICD-10-CM | POA: Diagnosis not present

## 2017-02-11 ENCOUNTER — Ambulatory Visit: Payer: 59

## 2017-02-21 ENCOUNTER — Ambulatory Visit
Admission: RE | Admit: 2017-02-21 | Discharge: 2017-02-21 | Disposition: A | Discharge status: Home / Self Care | Payer: 59 | Source: Ambulatory Visit | Attending: Nurse Practitioner | Admitting: Nurse Practitioner

## 2017-02-21 DIAGNOSIS — Z1231 Encounter for screening mammogram for malignant neoplasm of breast: Secondary | ICD-10-CM | POA: Insufficient documentation

## 2017-03-05 DIAGNOSIS — E119 Type 2 diabetes mellitus without complications: Secondary | ICD-10-CM | POA: Insufficient documentation

## 2017-03-15 DIAGNOSIS — R7302 Impaired glucose tolerance (oral): Secondary | ICD-10-CM | POA: Diagnosis not present

## 2017-03-15 DIAGNOSIS — I10 Essential (primary) hypertension: Secondary | ICD-10-CM | POA: Diagnosis not present

## 2017-03-15 DIAGNOSIS — J449 Chronic obstructive pulmonary disease, unspecified: Secondary | ICD-10-CM | POA: Diagnosis not present

## 2017-04-09 DIAGNOSIS — R1031 Right lower quadrant pain: Secondary | ICD-10-CM | POA: Diagnosis not present

## 2017-08-25 ENCOUNTER — Other Ambulatory Visit: Payer: Self-pay

## 2017-08-25 ENCOUNTER — Emergency Department
Admission: EM | Admit: 2017-08-25 | Discharge: 2017-08-25 | Disposition: A | Discharge status: Home / Self Care | Payer: 59 | Attending: Emergency Medicine | Admitting: Emergency Medicine

## 2017-08-25 ENCOUNTER — Emergency Department: Payer: 59

## 2017-08-25 ENCOUNTER — Encounter: Payer: Self-pay | Admitting: Emergency Medicine

## 2017-08-25 DIAGNOSIS — R0600 Dyspnea, unspecified: Secondary | ICD-10-CM | POA: Insufficient documentation

## 2017-08-25 DIAGNOSIS — J441 Chronic obstructive pulmonary disease with (acute) exacerbation: Secondary | ICD-10-CM | POA: Insufficient documentation

## 2017-08-25 DIAGNOSIS — R05 Cough: Secondary | ICD-10-CM | POA: Diagnosis not present

## 2017-08-25 DIAGNOSIS — R0602 Shortness of breath: Secondary | ICD-10-CM | POA: Diagnosis not present

## 2017-08-25 LAB — CBC
HCT: 50.1 % — ABNORMAL HIGH (ref 35.0–47.0)
Hemoglobin: 17.4 g/dL — ABNORMAL HIGH (ref 12.0–16.0)
MCH: 30.3 pg (ref 26.0–34.0)
MCHC: 34.7 g/dL (ref 32.0–36.0)
MCV: 87.3 fL (ref 80.0–100.0)
PLATELETS: 215 10*3/uL (ref 150–440)
RBC: 5.74 MIL/uL — ABNORMAL HIGH (ref 3.80–5.20)
RDW: 14.2 % (ref 11.5–14.5)
WBC: 11 10*3/uL (ref 3.6–11.0)

## 2017-08-25 LAB — BASIC METABOLIC PANEL
Anion gap: 10 (ref 5–15)
BUN: 15 mg/dL (ref 6–20)
CALCIUM: 9.4 mg/dL (ref 8.9–10.3)
CHLORIDE: 105 mmol/L (ref 101–111)
CO2: 23 mmol/L (ref 22–32)
CREATININE: 0.77 mg/dL (ref 0.44–1.00)
GFR calc Af Amer: >60 mL/min (ref >60)
GFR calc non Af Amer: >60 mL/min (ref >60)
Glucose, Bld: 132 mg/dL — ABNORMAL HIGH (ref 65–99)
Potassium: 3.9 mmol/L (ref 3.5–5.1)
Sodium: 138 mmol/L (ref 135–145)

## 2017-08-25 LAB — TROPONIN I

## 2017-08-25 MED ORDER — IPRATROPIUM-ALBUTEROL 0.5-2.5 (3) MG/3ML IN SOLN
3.0000 mL | Freq: Once | RESPIRATORY_TRACT | Status: AC
  Administered 2017-08-25: 3 mL via RESPIRATORY_TRACT
  Filled 2017-08-25: qty 3

## 2017-08-25 MED ORDER — PREDNISONE 20 MG PO TABS: 40.0000 mg | ORAL_TABLET | Freq: Every day | ORAL | 0 refills | Status: DC

## 2017-08-25 MED ORDER — PREDNISONE 20 MG PO TABS
60.0000 mg | ORAL_TABLET | Freq: Once | ORAL | Status: AC
  Administered 2017-08-25: 60 mg via ORAL
  Filled 2017-08-25: qty 6

## 2017-08-25 MED ORDER — AZITHROMYCIN 250 MG PO TABS: ORAL_TABLET | ORAL | 0 refills | Status: AC

## 2017-08-25 MED ORDER — BENZONATATE 100 MG PO CAPS
200.0000 mg | ORAL_CAPSULE | Freq: Once | ORAL | Status: AC
  Administered 2017-08-25: 200 mg via ORAL
  Filled 2017-08-25: qty 2

## 2017-08-25 MED ORDER — BENZONATATE 100 MG PO CAPS: 100.0000 mg | ORAL_CAPSULE | Freq: Four times a day (QID) | ORAL | 0 refills | Status: DC | PRN

## 2017-08-25 NOTE — ED Notes (Signed)
AAOx3.  Skin warm and dry. NAD.  Lungs with improved air entry auscultated.  No SOB/ DOE.  NAD

## 2017-08-25 NOTE — Discharge Instructions (Addendum)
Please take your medications as prescribed.  Return to the emergency department for any increased trouble breathing, or any other symptom personally concerning to yourself.  Otherwise please follow-up with your doctor in the next 1 to 2 days for recheck/reevaluation.

## 2017-08-25 NOTE — ED Notes (Signed)
Pt brought back to exam room ambulatory, states that she thinks she has some upper resp infection, states hx of copd and asthma that she uses breathing treatments and inhalers for, states that this has happened before, and that she just got hot while out in the lobby that triggered her to have a coughing spell

## 2017-08-25 NOTE — ED Notes (Signed)
AAOx3.  Skin warm and dry.  NAD 

## 2017-08-25 NOTE — ED Provider Notes (Signed)
[facility] Emergency Department Provider Note  Time seen: 3:07 PM  I have reviewed the triage vital signs and the nursing notes.   HISTORY  Chief Complaint Shortness of Breath    HPI Heather Nguyen is a 57 y.o. female with a past medical history of asthma, COPD, hypertension presents to the emergency department for shortness of breath and cough.  According to the patient for the past 1 week she has been experiencing shortness of breath and cough.  States occasionally she has yellow sputum production, denies any fever at any point.  Denies any chest pain.  Patient states usually she sees her doctor put her on Tessalon Perles and that helps a lot.   Past Medical History:  Diagnosis Date  . Asthma   . Hypertension     Patient Active Problem List   Diagnosis Date Noted  . Asthma exacerbation 06/16/2015    Past Surgical History:  Procedure Laterality Date  . 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 Authorizing Provider  albuterol (PROVENTIL HFA;VENTOLIN HFA) 108 (90 Base) MCG/ACT inhaler Inhale 2 puffs into the lungs every 6 (six) hours as needed for wheezing or shortness of breath.    [provider]  brompheniramine-pseudoephedrine-DM 30-2-10 MG/5ML syrup Take 5 mLs by mouth 4 (four) times daily as needed. 12/19/15   Sable Feil, PA-C  doxycycline (VIBRAMYCIN) 100 MG capsule Take 1 capsule (100 mg total) by mouth 2 (two) times daily. 06/14/16   Carrie Mew, MD  Fluticasone-Salmeterol (ADVAIR DISKUS) 250-50 MCG/DOSE AEPB Inhale 1 puff into the lungs 2 (two) times daily. 06/18/15   Demetrios Loll, MD  guaiFENesin (ROBITUSSIN) 100 MG/5ML SOLN Take 5 mLs (100 mg total) by mouth every 4 (four) hours as needed for cough or to loosen phlegm. 06/14/16   Carrie Mew, MD  ibuprofen (ADVIL,MOTRIN) 200 MG tablet Take 400 mg by mouth every 6 (six) hours as  needed for headache or mild pain. Reported on 07/07/2015    [provider]  lisinopril (PRINIVIL,ZESTRIL) 5 MG tablet Take 5 mg by mouth daily.    [provider]  predniSONE (DELTASONE) 20 MG tablet Take 2 tablets (40 mg total) by mouth daily. 06/14/16   Carrie Mew, MD    Allergies  Allergen Reactions  . Codeine Anaphylaxis    Family History  Problem Relation Age of Onset  . CAD Father   . Asthma Mother   . Breast cancer Cousin 62       pat cousin    Social History Social History   Tobacco Use  . Smoking status: Former Smoker    Packs/day: 0.25    Years: 35.00    Pack years: 8.75    Types: Cigarettes    Last attempt to quit: 06/13/2015    Years since quitting: 2.2  . Smokeless tobacco: Never Used  Substance Use Topics  . Alcohol use: No    Alcohol/week: 0.0 oz  . Drug use: No    Review of Systems Constitutional: Negative for fever. ENT: Minimal congestion Cardiovascular: Negative for chest pain. Respiratory: Positive for shortness of breath, cough. Gastrointestinal: Negative for abdominal pain, vomiting Musculoskeletal: Negative for leg pain or swelling Skin: Negative for skin complaints  Neurological: Negative for headache All other ROS negative  ____________________________________________   PHYSICAL EXAM:  VITAL SIGNS: ED Triage Vitals  Enc Vitals Group  BP 08/25/17 1419 (!) 175/89     Pulse Rate 08/25/17 1419 (!) 103     Resp 08/25/17 1419 (!) 26     Temp 08/25/17 1419 97.8 F (36.6 C)     Temp src --      SpO2 08/25/17 1419 92 %     Weight 08/25/17 1425 220 lb (99.8 kg)     Height 08/25/17 1425 5\' 1"  (1.549 m)     Head Circumference --      Peak Flow --      Pain Score 08/25/17 1425 0     Pain Loc --      Pain Edu? --      Excl. in [place]? --    Constitutional: Alert and oriented. Well appearing and in no distress. Eyes: Normal exam ENT   Head: Normocephalic and atraumatic.   Mouth/Throat: Mucous membranes are  moist. Cardiovascular: Normal rate, regular rhythm. No murmur Respiratory: Normal respiratory effort without tachypnea nor retractions.  Mild expiratory wheeze bilaterally.  No rales or rhonchi. Gastrointestinal: Soft and nontender. No distention Musculoskeletal: Nontender with normal range of motion in all extremities. No lower extremity tenderness or edema. Neurologic:  Normal speech and language. No gross focal neurologic deficits Skin:  Skin is warm, dry and intact.  Psychiatric: Mood and affect are normal.   ____________________________________________    EKG  EKG reviewed and interpreted by myself shows normal sinus rhythm at 100 bpm with a narrow QRS, normal axis, normal intervals, no concerning ST changes.  ____________________________________________    RADIOLOGY  X-ray negative  ____________________________________________   INITIAL IMPRESSION / ASSESSMENT AND PLAN / ED COURSE  Pertinent labs & imaging results that were available during my care of the patient were reviewed by me and considered in my medical decision making (see chart for details).  Patient presents to the emergency department for cough and shortness of breath ongoing for the past 1 week.  Differential would include upper respiratory infection, pneumonia, bronchitis, COPD exacerbation, CHF.  We will check labs including cardiac enzymes, chest x-ray and continue to closely monitor.  We will treat with prednisone and duo nebs while awaiting results.  Currently during my evaluation the patient is a 95% room air saturation.  States that is her normal.  X-ray negative.  Labs are reassuring, troponin negative.  Patient's oxygen saturation does occasionally dip as low as 89% on room air, is normally 92 to 93% for the most part.  Given the patient's intermittent hypoxia discussed with the patient she would benefit from admission to the hospital, patient strongly wishes to be discharged home, does not want to be  admitted.  Highly suspect COPD exacerbation/upper respiratory infection.  Will cover with steroids and antibiotics as a precaution.  I discussed with the patient if she worsens at all at home she is to return to the emergency department for reevaluation and likely admission.  Patient agreeable to plan of care. ____________________________________________   FINAL CLINICAL IMPRESSION(S) / ED DIAGNOSES  Cough Dyspnea COPD exacerbation   Harvest Dark, MD 08/25/17 1657

## 2017-08-25 NOTE — ED Triage Notes (Signed)
Pt comes into the ED via POV c/o shortness of breath and cough.  Patient is a COPD and asthma patient.  Patient states that her cough is productive with yellow sputum.  Patient has even and unlabored respirations at this time.  Denies any chest pain or dizziness.  Patient denies any fevers at home but presents tachycardic.

## 2017-09-02 DIAGNOSIS — J441 Chronic obstructive pulmonary disease with (acute) exacerbation: Secondary | ICD-10-CM | POA: Diagnosis not present

## 2017-09-17 DIAGNOSIS — Z720 Tobacco use: Secondary | ICD-10-CM | POA: Diagnosis not present

## 2017-09-17 DIAGNOSIS — Z79899 Other long term (current) drug therapy: Secondary | ICD-10-CM | POA: Diagnosis not present

## 2017-09-17 DIAGNOSIS — R7302 Impaired glucose tolerance (oral): Secondary | ICD-10-CM | POA: Diagnosis not present

## 2017-09-17 DIAGNOSIS — Z0000 Encounter for general adult medical examination without abnormal findings: Secondary | ICD-10-CM | POA: Diagnosis not present

## 2017-09-17 DIAGNOSIS — I10 Essential (primary) hypertension: Secondary | ICD-10-CM | POA: Diagnosis not present

## 2017-10-12 ENCOUNTER — Other Ambulatory Visit: Payer: Self-pay

## 2017-10-12 ENCOUNTER — Ambulatory Visit
Admission: EM | Admit: 2017-10-12 | Discharge: 2017-10-12 | Disposition: A | Discharge status: Home / Self Care | Payer: 59 | Attending: Family Medicine | Admitting: Family Medicine

## 2017-10-12 DIAGNOSIS — M5431 Sciatica, right side: Secondary | ICD-10-CM | POA: Diagnosis not present

## 2017-10-12 DIAGNOSIS — S39012A Strain of muscle, fascia and tendon of lower back, initial encounter: Secondary | ICD-10-CM

## 2017-10-12 HISTORY — DX: Type 2 diabetes mellitus without complications: E11.9

## 2017-10-12 LAB — URINALYSIS, COMPLETE (UACMP) WITH MICROSCOPIC
BILIRUBIN URINE: NEGATIVE
Bacteria, UA: NONE SEEN
GLUCOSE, UA: NEGATIVE mg/dL
KETONES UR: NEGATIVE mg/dL
LEUKOCYTES UA: NEGATIVE
NITRITE: NEGATIVE
PH: 5.5 (ref 5.0–8.0)
Protein, ur: NEGATIVE mg/dL
RBC / HPF: NONE SEEN RBC/hpf (ref 0–5)
Specific Gravity, Urine: <1.005 — ABNORMAL LOW (ref 1.005–1.030)
WBC, UA: NONE SEEN WBC/hpf (ref 0–5)

## 2017-10-12 MED ORDER — PREDNISONE 20 MG PO TABS: 20.0000 mg | ORAL_TABLET | Freq: Every day | ORAL | 0 refills | Status: DC

## 2017-10-12 MED ORDER — CYCLOBENZAPRINE HCL 10 MG PO TABS: 10.0000 mg | ORAL_TABLET | Freq: Three times a day (TID) | ORAL | 0 refills | Status: DC | PRN

## 2017-10-12 NOTE — Discharge Instructions (Signed)
Extra strength tylenol: 2 tablets four times per day Heating pad to area

## 2017-10-12 NOTE — ED Triage Notes (Signed)
Pt with pain in right low back and radiating into right side and groin starting on Thursday. Frequent urination. Pain 10/10

## 2017-10-13 NOTE — ED Provider Notes (Signed)
[facility]    CSN: [id number] Arrival date & time: 10/12/17  1829     History   Chief Complaint Chief Complaint  Patient presents with  . Back Pain    HPI Heather Nguyen is a 57 y.o. female.   The history is provided by the patient.  Back Pain  Location:  Lumbar spine Quality:  Aching Radiates to:  R posterior upper leg Pain severity:  Moderate Pain is:  Same all the time Onset quality:  Sudden Duration:  3 days Timing:  Constant Progression:  Unchanged Chronicity:  New Context: not emotional stress, not falling, not jumping from heights, not lifting heavy objects, not MCA, not MVA, not occupational injury, not pedestrian accident, not physical stress, not recent illness, not recent injury and not twisting   Relieved by:  Nothing Worsened by:  Bending and twisting Ineffective treatments:  OTC medications Associated symptoms: no abdominal pain, no abdominal swelling, no bladder incontinence, no bowel incontinence, no chest pain, no dysuria, no fever, no headaches, no leg pain, no numbness, no paresthesias, no pelvic pain, no perianal numbness, no tingling, no weakness and no weight loss   Associated symptoms comment:  Also c/o frequent urination; denies pain with urination Risk factors: no hx of cancer, no hx of osteoporosis, no lack of exercise, no menopause, not obese, not pregnant, no recent surgery, no steroid use and no vascular disease     Past Medical History:  Diagnosis Date  . Asthma   . Diabetes mellitus without complication ([place])   . Hypertension     Patient Active Problem List   Diagnosis Date Noted  . Asthma exacerbation 06/16/2015    Past Surgical History:  Procedure Laterality Date  . 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 Authorizing Provider  metFORMIN (GLUCOPHAGE-XR) 500 MG 24 hr tablet Take by mouth. 09/19/17  Yes [provider]  albuterol (PROVENTIL HFA;VENTOLIN HFA) 108 (90 Base) MCG/ACT inhaler Inhale 2 puffs into the lungs every 6 (six) hours as needed for wheezing or shortness of breath.    [provider]  ASMANEX, 30 METERED DOSES, 220 MCG/INH inhaler INHALE 1 INHALATION INTO THE LUNGS ONCE DAILY. 09/11/17   [provider]  atorvastatin (LIPITOR) 10 MG tablet Take 10 mg by mouth daily. 09/19/17   [provider]  benzonatate (TESSALON PERLES) 100 MG capsule Take 1 capsule (100 mg total) by mouth every 6 (six) hours as needed for cough. 08/25/17 08/25/18  Harvest Dark, MD  brompheniramine-pseudoephedrine-DM 30-2-10 MG/5ML syrup Take 5 mLs by mouth 4 (four) times daily as needed. 12/19/15   Sable Feil, PA-C  cyclobenzaprine (FLEXERIL) 10 MG tablet Take 1 tablet (10 mg total) by mouth 3 (three) times daily as needed for muscle spasms. 10/12/17   Norval Gable, MD  doxycycline (VIBRAMYCIN) 100 MG capsule Take 1 capsule (100 mg total) by mouth 2 (two) times daily. 06/14/16   Joni Fears,  Doren Custard, MD  Fluticasone-Salmeterol (ADVAIR DISKUS) 250-50 MCG/DOSE AEPB Inhale 1 puff into the lungs 2 (two) times daily. 06/18/15   Demetrios Loll, MD  guaiFENesin (ROBITUSSIN) 100 MG/5ML SOLN Take 5 mLs (100 mg total) by mouth every 4 (four) hours as needed for cough or to loosen phlegm. 06/14/16   Carrie Mew, MD  ibuprofen (ADVIL,MOTRIN) 200 MG tablet Take 400 mg by mouth every 6 (six) hours as needed for headache or mild pain. Reported on 07/07/2015    [provider]  lisinopril (PRINIVIL,ZESTRIL) 5 MG tablet Take 5 mg by mouth daily.    [provider]  losartan (COZAAR) 25 MG tablet Take 25 mg by mouth daily. 09/11/17   [provider]  predniSONE (DELTASONE) 20 MG  tablet Take 1 tablet (20 mg total) by mouth daily. 10/12/17   Norval Gable, MD    Family History Family History  Problem Relation Age of Onset  . CAD Father   . Asthma Mother   . Breast cancer Cousin 44       pat cousin    Social History Social History   Tobacco Use  . Smoking status: Former Smoker    Packs/day: 1.00    Years: 35.00    Pack years: 35.00    Types: Cigarettes  . Smokeless tobacco: Never Used  Substance Use Topics  . Alcohol use: No    Alcohol/week: 0.0 standard drinks  . Drug use: No     Allergies   Codeine   Review of Systems Review of Systems  Constitutional: Negative for fever and weight loss.  Cardiovascular: Negative for chest pain.  Gastrointestinal: Negative for abdominal pain and bowel incontinence.  Genitourinary: Negative for bladder incontinence, dysuria and pelvic pain.  Musculoskeletal: Positive for back pain.  Neurological: Negative for tingling, weakness, numbness, headaches and paresthesias.     Physical Exam Triage Vital Signs ED Triage Vitals  Enc Vitals Group     BP 10/12/17 1005 (!) 180/78     Pulse Rate 10/12/17 1005 89     Resp 10/12/17 1005 20     Temp 10/12/17 1005 97.9 F (36.6 C)     Temp Source 10/12/17 1005 Oral     SpO2 10/12/17 1005 94 %     Weight 10/12/17 1000 210 lb (95.3 kg)     Height 10/12/17 1000 5\' 1"  (1.549 m)     Head Circumference --      Peak Flow --      Pain Score 10/12/17 1000 10     Pain Loc --      Pain Edu? --      Excl. in [place]? --    No data found.  Updated Vital Signs BP (!) 180/78 (BP Location: Left Arm) Comment: has not taken her B/P med yet today  Pulse 89   Temp 97.9 F (36.6 C) (Oral)   Resp 20   Ht 5\' 1"  (1.549 m)   Wt 95.3 kg   SpO2 94%   BMI 39.68 kg/m   Visual Acuity Right Eye Distance:   Left Eye Distance:   Bilateral Distance:    Right Eye Near:   Left Eye Near:    Bilateral Near:     Physical Exam  Constitutional: She appears well-developed and  well-nourished. No distress.  Musculoskeletal: She exhibits tenderness. She exhibits no edema.       Lumbar back: She exhibits tenderness and spasm. She exhibits normal range of motion, no bony tenderness, no swelling, no edema,  no deformity, no laceration, no pain and normal pulse.  Neurological: She is alert. She has normal reflexes. She displays normal reflexes. She exhibits normal muscle tone.  Skin: Skin is warm and dry. No rash noted. She is not diaphoretic. No erythema.  Nursing note and vitals reviewed.    UC Treatments / Results  Labs (all labs ordered are listed, but only abnormal results are displayed) Labs Reviewed  URINALYSIS, COMPLETE (UACMP) WITH MICROSCOPIC - Abnormal; Notable for the following components:      Result Value   Color, Urine STRAW (*)    Specific Gravity, Urine <1.005 (*)    Hgb urine dipstick TRACE (*)    All other components within normal limits    EKG None  Radiology No results found.  Procedures Procedures (including critical care time)  Medications Ordered in UC Medications - No data to display  Initial Impression / Assessment and Plan / UC Course  I have reviewed the triage vital signs and the nursing notes.  Pertinent labs & imaging results that were available during my care of the patient were reviewed by me and considered in my medical decision making (see chart for details).      Final Clinical Impressions(s) / UC Diagnoses   Final diagnoses:  Strain of lumbar region, initial encounter  Sciatica of right side     Discharge Instructions     Extra strength tylenol: 2 tablets four times per day Heating pad to area    ED Prescriptions    Medication Sig Dispense Auth. Provider   cyclobenzaprine (FLEXERIL) 10 MG tablet Take 1 tablet (10 mg total) by mouth 3 (three) times daily as needed for muscle spasms. 30 tablet Norval Gable, MD   predniSONE (DELTASONE) 20 MG tablet Take 1 tablet (20 mg total) by mouth daily. 5 tablet  Norval Gable, MD     1. diagnosis reviewed with patient 2. rx as per orders above; reviewed possible side effects, interactions, risks and benefits  3. Recommend supportive treatment as above 4. Follow-up prn if symptoms worsen or don't improve   Controlled Substance Prescriptions [place] Controlled Substance Registry consulted? Not Applicable   Norval Gable, MD 10/13/17 1053

## 2018-02-16 ENCOUNTER — Ambulatory Visit
Admission: EM | Admit: 2018-02-16 | Discharge: 2018-02-16 | Disposition: A | Discharge status: Home / Self Care | Payer: 59 | Attending: Emergency Medicine | Admitting: Emergency Medicine

## 2018-02-16 ENCOUNTER — Encounter: Payer: Self-pay | Admitting: Gynecology

## 2018-02-16 ENCOUNTER — Other Ambulatory Visit: Payer: Self-pay

## 2018-02-16 DIAGNOSIS — J441 Chronic obstructive pulmonary disease with (acute) exacerbation: Secondary | ICD-10-CM | POA: Diagnosis not present

## 2018-02-16 MED ORDER — BENZONATATE 200 MG PO CAPS: ORAL_CAPSULE | ORAL | 0 refills | Status: DC

## 2018-02-16 MED ORDER — METHYLPREDNISOLONE 4 MG PO TBPK: ORAL_TABLET | ORAL | 0 refills | Status: DC

## 2018-02-16 MED ORDER — DOXYCYCLINE HYCLATE 100 MG PO CAPS: 100.0000 mg | ORAL_CAPSULE | Freq: Two times a day (BID) | ORAL | 0 refills | Status: DC

## 2018-02-16 NOTE — Discharge Instructions (Addendum)
Use your nebulizer at home as necessary to help with your shortness of breath.  If you run high fevers or not improving in 2 days return to our clinic or follow-up with your primary care physician

## 2018-02-16 NOTE — ED Provider Notes (Signed)
[facility]    CSN: [id number] Arrival date & time: 02/16/18  1194     History   Chief Complaint Chief Complaint  Patient presents with  . Cough    HPI Heather Nguyen is a 57 y.o. female.   HPI  57 year old female complaining of cough with brownish mucus.  That is for several days.  She is a history of asthma and COPD.  She not taken her blood pressure medication or inhaler medication this morning.  Not have fever or chills.  States her O2 sats normally run at 94 range on room air.        Past Medical History:  Diagnosis Date  . Asthma   . Diabetes mellitus without complication ([place])   . Hypertension     Patient Active Problem List   Diagnosis Date Noted  . Asthma exacerbation 06/16/2015    Past Surgical History:  Procedure Laterality Date  . 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 Authorizing Provider  albuterol (PROVENTIL HFA;VENTOLIN HFA) 108 (90 Base) MCG/ACT inhaler Inhale 2 puffs into the lungs every 6 (six) hours as needed for wheezing or shortness of breath.   Yes [provider]  ASMANEX, 30 METERED DOSES, 220 MCG/INH inhaler INHALE 1 INHALATION INTO THE LUNGS ONCE DAILY. 09/11/17  Yes [provider]  atorvastatin (LIPITOR) 10 MG tablet Take 10 mg by mouth daily. 09/19/17  Yes [provider]  brompheniramine-pseudoephedrine-DM 30-2-10 MG/5ML syrup Take 5 mLs by mouth 4 (four) times daily as needed. 12/19/15  Yes Sable Feil, PA-C  cyclobenzaprine (FLEXERIL) 10 MG tablet Take 1 tablet (10 mg total) by mouth 3 (three) times daily as needed for muscle spasms. 10/12/17  Yes Conty, Linward Foster, MD  Fluocinonide 0.1 % CREA  Apply topically. 11/23/15  Yes [provider]  ibuprofen (ADVIL,MOTRIN) 200 MG tablet Take 400 mg by mouth every 6 (six) hours as needed for headache or mild pain. Reported on 07/07/2015   Yes [provider]  lisinopril (PRINIVIL,ZESTRIL) 5 MG tablet Take 5 mg by mouth daily.   Yes [provider]  losartan (COZAAR) 25 MG tablet Take 25 mg by mouth daily. 09/11/17  Yes [provider]  metFORMIN (GLUCOPHAGE-XR) 500 MG 24 hr tablet Take by mouth. 09/19/17  Yes [provider]  montelukast (SINGULAIR) 10 MG tablet Take by mouth. 09/17/17  Yes [provider]  benzonatate (TESSALON) 200 MG capsule Take one cap TID PRN cough 02/16/18   Lorin Picket, PA-C  doxycycline (VIBRAMYCIN) 100 MG capsule Take 1 capsule (100 mg total) by mouth 2 (two) times daily. 02/16/18   Lorin Picket, PA-C  Fluticasone-Salmeterol (ADVAIR DISKUS) 250-50 MCG/DOSE AEPB Inhale 1 puff into the lungs 2 (two) times daily. 06/18/15   Demetrios Loll, MD  methylPREDNISolone (MEDROL DOSEPAK) 4 MG TBPK tablet Take per package instructions 02/16/18   Lorin Picket, PA-C    Family History  Family History  Problem Relation Age of Onset  . CAD Father   . Asthma Mother   . Breast cancer Cousin 24       pat cousin    Social History Social History   Tobacco Use  . Smoking status: Former Smoker    Packs/day: 1.00    Years: 35.00    Pack years: 35.00    Types: Cigarettes  . Smokeless tobacco: Never Used  Substance Use Topics  . Alcohol use: No    Alcohol/week: 0.0 standard drinks  . Drug use: No     Allergies   Codeine   Review of Systems Review of Systems  Constitutional: Positive for activity change. Negative for chills, fatigue and fever.  HENT: Positive for congestion.   Respiratory: Positive for cough, shortness of breath and wheezing.   All other systems reviewed and are negative.    Physical Exam Triage Vital Signs ED Triage Vitals  Enc Vitals  Group     BP 02/16/18 0948 (!) 170/92     Pulse Rate 02/16/18 0948 (!) 111     Resp 02/16/18 0948 18     Temp 02/16/18 0948 98.3 F (36.8 C)     Temp Source 02/16/18 0948 Oral     SpO2 02/16/18 0948 92 %     Weight 02/16/18 0951 220 lb (99.8 kg)     Height 02/16/18 0951 5\' 1"  (1.549 m)     Head Circumference --      Peak Flow --      Pain Score 02/16/18 0951 0     Pain Loc --      Pain Edu? --      Excl. in [place]? --    No data found.  Updated Vital Signs BP (!) 170/92   Pulse (!) 111   Temp 98.3 F (36.8 C) (Oral)   Resp 18   Ht 5\' 1"  (1.549 m)   Wt 220 lb (99.8 kg)   SpO2 92%   BMI 41.57 kg/m   Visual Acuity Right Eye Distance:   Left Eye Distance:   Bilateral Distance:    Right Eye Near:   Left Eye Near:    Bilateral Near:     Physical Exam Vitals signs and nursing note reviewed.  Constitutional:      General: She is not in acute distress.    Appearance: Normal appearance. She is not ill-appearing, toxic-appearing or diaphoretic.  HENT:     Head: Normocephalic.     Right Ear: Tympanic membrane and ear canal normal.     Left Ear: Tympanic membrane and ear canal normal.     Nose: Nose normal.     Mouth/Throat:     Mouth: Mucous membranes are moist.     Pharynx: No oropharyngeal exudate or posterior oropharyngeal erythema.  Eyes:     Pupils: Pupils are equal, round, and reactive to light.  Neck:     Musculoskeletal: Normal range of motion and neck supple.  Pulmonary:     Effort: Pulmonary effort is normal. No respiratory distress.     Breath sounds: No stridor. Wheezing and rhonchi present. No rales.     Comments: Patient has no respiratory distress.  Have wheezing and rhonchi that are.  No crackles are appreciated. Musculoskeletal: Normal range of motion.  Skin:    General: Skin is warm and dry.  Neurological:     General: No focal deficit present.     Mental Status: She is alert and oriented to  person, place, and time.  Psychiatric:        Mood and  Affect: Mood normal.        Behavior: Behavior normal.        Thought Content: Thought content normal.        Judgment: Judgment normal.      UC Treatments / Results  Labs (all labs ordered are listed, but only abnormal results are displayed) Labs Reviewed - No data to display  EKG None  Radiology No results found.  Procedures Procedures (including critical care time)  Medications Ordered in UC Medications - No data to display  Initial Impression / Assessment and Plan / UC Course  I have reviewed the triage vital signs and the nursing notes.  Pertinent labs & imaging results that were available during my care of the patient were reviewed by me and considered in my medical decision making (see chart for details).   I will place the patient on a Medrol Dosepak which she states has helped her the most in the past.  Also place her on doxycycline for 7 days.  Does well with Ladona Ridgel which were provided to her.  Offered her a DuoNeb treatment today but she prefers to do it at home.  Is stable at the present time and I believe that this would be fine.  Also reminded her that to take her blood pressure medication when she returns home.  She worsens she should go to the emergency room.   Final Clinical Impressions(s) / UC Diagnoses   Final diagnoses:  COPD exacerbation [facility])     Discharge Instructions     Use your nebulizer at home as necessary to help with your shortness of breath.  If you run high fevers or not improving in 2 days return to our clinic or follow-up with your primary care physician   ED Prescriptions    Medication Sig Dispense Auth. Provider   methylPREDNISolone (MEDROL DOSEPAK) 4 MG TBPK tablet Take per package instructions 21 tablet Lorin Picket, PA-C   doxycycline (VIBRAMYCIN) 100 MG capsule Take 1 capsule (100 mg total) by mouth 2 (two) times daily. 14 capsule Crecencio Mc P, PA-C   benzonatate (TESSALON) 200 MG capsule Take one cap TID PRN  cough 30 capsule Lorin Picket, PA-C     Controlled Substance Prescriptions [place] Controlled Substance Registry consulted? Not Applicable   Lorin Picket, PA-C 02/16/18 3825

## 2018-02-16 NOTE — ED Triage Notes (Signed)
Patient c/o cough / brownish mucous . Per patient history of asthma / COPD. Patient also stated has not taken her blood pressure or her inhaler medication this morning.

## 2018-06-10 ENCOUNTER — Ambulatory Visit: Admission: RE | Admit: 2018-06-10 | Payer: 59 | Source: Home / Self Care | Admitting: Gastroenterology

## 2018-06-10 ENCOUNTER — Encounter: Admission: RE | Payer: Self-pay | Source: Home / Self Care

## 2018-06-10 SURGERY — COLONOSCOPY WITH PROPOFOL
Anesthesia: General

## 2018-09-22 ENCOUNTER — Other Ambulatory Visit: Payer: Self-pay | Admitting: Nurse Practitioner

## 2018-09-22 DIAGNOSIS — Z1231 Encounter for screening mammogram for malignant neoplasm of breast: Secondary | ICD-10-CM

## 2019-12-03 ENCOUNTER — Other Ambulatory Visit: Payer: Self-pay | Admitting: Nurse Practitioner

## 2019-12-03 DIAGNOSIS — Z1231 Encounter for screening mammogram for malignant neoplasm of breast: Secondary | ICD-10-CM

## 2019-12-15 ENCOUNTER — Encounter (INDEPENDENT_AMBULATORY_CARE_PROVIDER_SITE_OTHER): Payer: Self-pay

## 2019-12-15 ENCOUNTER — Other Ambulatory Visit: Payer: Self-pay

## 2019-12-15 ENCOUNTER — Ambulatory Visit
Admission: RE | Admit: 2019-12-15 | Discharge: 2019-12-15 | Disposition: A | Discharge status: Home / Self Care | Payer: 59 | Source: Ambulatory Visit | Attending: Nurse Practitioner | Admitting: Nurse Practitioner

## 2019-12-15 DIAGNOSIS — Z1231 Encounter for screening mammogram for malignant neoplasm of breast: Secondary | ICD-10-CM | POA: Insufficient documentation

## 2020-02-20 ENCOUNTER — Ambulatory Visit
Admission: EM | Admit: 2020-02-20 | Discharge: 2020-02-20 | Disposition: A | Discharge status: Home / Self Care | Payer: 59

## 2020-02-20 ENCOUNTER — Ambulatory Visit (INDEPENDENT_AMBULATORY_CARE_PROVIDER_SITE_OTHER): Payer: 59

## 2020-02-20 ENCOUNTER — Other Ambulatory Visit: Payer: Self-pay

## 2020-02-20 DIAGNOSIS — R059 Cough, unspecified: Secondary | ICD-10-CM | POA: Diagnosis not present

## 2020-02-20 DIAGNOSIS — J441 Chronic obstructive pulmonary disease with (acute) exacerbation: Secondary | ICD-10-CM

## 2020-02-20 DIAGNOSIS — R0602 Shortness of breath: Secondary | ICD-10-CM | POA: Diagnosis not present

## 2020-02-20 MED ORDER — BENZONATATE 100 MG PO CAPS: 200.0000 mg | ORAL_CAPSULE | Freq: Three times a day (TID) | ORAL | 0 refills | Status: DC

## 2020-02-20 MED ORDER — PROMETHAZINE-DM 6.25-15 MG/5ML PO SYRP: 5.0000 mL | ORAL_SOLUTION | Freq: Four times a day (QID) | ORAL | 0 refills | Status: DC | PRN

## 2020-02-20 MED ORDER — DOXYCYCLINE HYCLATE 100 MG PO CAPS: 100.0000 mg | ORAL_CAPSULE | Freq: Two times a day (BID) | ORAL | 0 refills | Status: DC

## 2020-02-20 NOTE — ED Provider Notes (Signed)
[facility]    CSN: [id number] Arrival date & time: 02/20/20  2263      History   Chief Complaint Chief Complaint  Patient presents with  . Cough    HPI Heather Nguyen is a 59 y.o. female.   HPI  59 year old female here for evaluation of cough increasing shortness of breath.  Patient reports that she has had symptoms for the past week.  Her grandson was recently diagnosed with pneumonia.  Patient reports that she typically does not produce sputum but she has been producing clear sputum and till today when she is also streaks of blood in her sputum.  She had to use her nebulizer at an increased rate but it has helped the shortness of breath.  Patient's also had chills and nasal congestion.  Patient denies fever, sweats, ear pain or pressure, or sore throat.  Past Medical History:  Diagnosis Date  . Asthma   . Diabetes mellitus without complication ([place])   . Hypertension     Patient Active Problem List   Diagnosis Date Noted  . Asthma exacerbation 06/16/2015    Past Surgical History:  Procedure Laterality Date  . 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 Authorizing Provider  albuterol (PROVENTIL HFA;VENTOLIN HFA) 108 (90 Base) MCG/ACT inhaler Inhale 2 puffs into the lungs every 6 (six) hours as needed for wheezing or shortness of breath.    [provider]  ASMANEX, 30 METERED DOSES, 220 MCG/INH inhaler INHALE 1 INHALATION INTO THE LUNGS ONCE DAILY. 09/11/17   [provider]  atorvastatin (LIPITOR) 10 MG tablet Take 10 mg by mouth daily. 09/19/17   [provider]  benzonatate (TESSALON) 100 MG capsule Take 2 capsules (200 mg total) by  mouth every 8 (eight) hours. 02/20/20   Margarette Canada, NP  cyclobenzaprine (FLEXERIL) 10 MG tablet Take 1 tablet (10 mg total) by mouth 3 (three) times daily as needed for muscle spasms. 10/12/17   Norval Gable, MD  doxycycline (VIBRAMYCIN) 100 MG capsule Take 1 capsule (100 mg total) by mouth 2 (two) times daily. 02/20/20   Margarette Canada, NP  Fluocinonide 0.1 % CREA Apply topically. 11/23/15   [provider]  Fluticasone-Salmeterol (ADVAIR DISKUS) 250-50 MCG/DOSE AEPB Inhale 1 puff into the lungs 2 (two) times daily. 06/18/15   Demetrios Loll, MD  ibuprofen (ADVIL,MOTRIN) 200 MG tablet Take 400 mg by mouth every 6 (six) hours as needed for headache or mild pain. Reported on 07/07/2015    [provider]  lisinopril (PRINIVIL,ZESTRIL) 5 MG tablet Take 5 mg by mouth daily.    [provider]  losartan (COZAAR) 100 MG tablet Take 100 mg by mouth daily. 02/08/20   [provider]  losartan (COZAAR) 25 MG tablet Take 25 mg by mouth daily. 09/11/17   [provider]  metFORMIN (GLUCOPHAGE-XR) 500 MG 24 hr tablet Take by mouth. 09/19/17   [provider]  methylPREDNISolone (MEDROL DOSEPAK) 4 MG TBPK tablet Take per package instructions 02/16/18   Crecencio Mc P, PA-C  montelukast (SINGULAIR) 10 MG tablet Take by mouth. 09/17/17   [provider]  omeprazole (PRILOSEC) 40 MG capsule Take 40 mg by mouth daily. 02/08/20   [provider]  promethazine-dextromethorphan (PROMETHAZINE-DM) 6.25-15 MG/5ML syrup Take 5 mLs by mouth 4 (four) times daily as needed. 02/20/20   Margarette Canada, NP  triamcinolone ointment (KENALOG) 0.1 % Apply topically. 02/16/20   [provider]    Family History Family History  Problem Relation Age of Onset  . CAD Father   . Asthma Mother   . Breast cancer Cousin 17       pat cousin    Social History Social History   Tobacco Use  . Smoking status: Former Smoker    Packs/day: 1.00    Years: 35.00     Pack years: 35.00    Types: Cigarettes  . Smokeless tobacco: Never Used  Substance Use Topics  . Alcohol use: No    Alcohol/week: 0.0 standard drinks  . Drug use: No     Allergies   Codeine   Review of Systems Review of Systems  Constitutional: Negative for activity change, appetite change, diaphoresis and fever.  HENT: Positive for congestion and rhinorrhea. Negative for ear pain, sinus pressure, sinus pain and sore throat.   Respiratory: Positive for cough, shortness of breath and wheezing.   Cardiovascular: Negative for chest pain.  Musculoskeletal: Negative for arthralgias and myalgias.  Skin: Negative for rash.  Hematological: Negative.   Psychiatric/Behavioral: Negative.      Physical Exam Triage Vital Signs ED Triage Vitals  Enc Vitals Group     BP 02/20/20 0942 (!) 116/98     Pulse Rate 02/20/20 0942 88     Resp 02/20/20 0942 (!) 21     Temp 02/20/20 0942 98.5 F (36.9 C)     Temp Source 02/20/20 0942 Oral     SpO2 02/20/20 0942 95 %     Weight --      Height --      Head Circumference --      Peak Flow --      Pain Score 02/20/20 0937 0     Pain Loc --      Pain Edu? --      Excl. in [place]? --    No data found.  Updated Vital Signs BP (!) 116/98 (BP Location: Left Arm)   Pulse 88   Temp 98.5 F (36.9 C) (Oral)   Resp (!) 21   SpO2 95%   Visual Acuity Right Eye Distance:   Left Eye Distance:   Bilateral Distance:    Right Eye Near:   Left Eye Near:    Bilateral Near:     Physical Exam Vitals and nursing note reviewed.  Constitutional:      Appearance: She is obese. She is ill-appearing.  HENT:     Head: Normocephalic and atraumatic.     Right Ear: Tympanic membrane, ear canal and external ear normal.     Left Ear: Tympanic membrane, ear canal and external ear normal.     Nose: Congestion and rhinorrhea present.     Comments: The mucosa is mildly erythematous and edematous with clear nasal discharge. Eyes:     Extraocular Movements:  Extraocular movements intact.     Conjunctiva/sclera: Conjunctivae normal.     Pupils: Pupils are equal, round, and reactive to light.  Cardiovascular:  Rate and Rhythm: Normal rate and regular rhythm.     Pulses: Normal pulses.     Heart sounds: Normal heart sounds. No murmur heard. No gallop.   Pulmonary:     Breath sounds: Wheezing present.     Comments: Patient has decreased breath sounds in all fields.  She does have scattered wheezing in the upper lobes bilaterally. Skin:    General: Skin is warm and dry.     Capillary Refill: Capillary refill takes less than 2 seconds.     Findings: No erythema or rash.  Neurological:     General: No focal deficit present.     Mental Status: She is alert and oriented to person, place, and time.  Psychiatric:        Mood and Affect: Mood normal.        Thought Content: Thought content normal.        Judgment: Judgment normal.      UC Treatments / Results  Labs (all labs ordered are listed, but only abnormal results are displayed) Labs Reviewed - No data to display  EKG   Radiology DG Chest 2 View  Result Date: 02/20/2020 CLINICAL DATA:  Cough and shortness of breath.  Sputum production. EXAM: CHEST - 2 VIEW COMPARISON:  August 25, 2017 FINDINGS: No pneumothorax. The right lung is clear. Mild opacity in left base is similar in appearance to June of 2000 19 May represent chronic atelectasis or scarring. No acute infiltrates are identified. IMPRESSION: Mild opacity in left base is similar since June of 2019, most consistent with scarring or chronic atelectasis. No acute infiltrate identified. Electronically Signed   By: Dorise Bullion III M.D   On: 02/20/2020 11:01    Procedures Procedures (including critical care time)  Medications Ordered in UC Medications - No data to display  Initial Impression / Assessment and Plan / UC Course  I have reviewed the triage vital signs and the nursing notes.  Pertinent labs & imaging results  that were available during my care of the patient were reviewed by me and considered in my medical decision making (see chart for details).   Patient reports that she has had increased cough and shortness of breath over the past week.  Her grandson was recently diagnosed with pneumonia.  She has not had a fever.  She has had increase in sputum production, she typically does not have any, as well as shortness of breath and need of nebulizer to resolve symptoms.  Physical exam reveals lung sounds diminished in all fields with wheezes in bilateral upper lobes. Will obtain CXR.   Chest x-ray independently evaluated by me and compared to previous x-ray from August 25, 2017.  There is no evidence of focal consolidation or infiltrate.  The lung spaces are well aerated.  X-ray consistent with COPD exacerbation.  Will await radiology overread.  Radiology overread is consistent with my evaluation of the chest x-ray.  Will discharge patient home with a diagnosis of COPD exacerbation, put her on doxycycline give her Tessalon Perles for cough and Promethazine DM.  Patient is diabetic so will not prescribe steroids at this time.   Final Clinical Impressions(s) / UC Diagnoses   Final diagnoses:  COPD exacerbation ([place])     Discharge Instructions     The doxycycline twice daily with food for 10 days.  Use the Tessalon Perles as needed during the day for cough and the Promethazine DM at bedtime for cough congestion and sleep.  Continue all of  your current medications.  If your symptoms worsen return for reevaluation or see your PCP.    ED Prescriptions    Medication Sig Dispense Auth. Provider   doxycycline (VIBRAMYCIN) 100 MG capsule Take 1 capsule (100 mg total) by mouth 2 (two) times daily. 20 capsule Margarette Canada, NP   benzonatate (TESSALON) 100 MG capsule Take 2 capsules (200 mg total) by mouth every 8 (eight) hours. 21 capsule Margarette Canada, NP   promethazine-dextromethorphan (PROMETHAZINE-DM)  6.25-15 MG/5ML syrup Take 5 mLs by mouth 4 (four) times daily as needed. 118 mL Margarette Canada, NP     PDMP not reviewed this encounter.   Margarette Canada, NP 02/20/20 [phone]

## 2020-02-20 NOTE — Discharge Instructions (Addendum)
The doxycycline twice daily with food for 10 days.  Use the Tessalon Perles as needed during the day for cough and the Promethazine DM at bedtime for cough congestion and sleep.  Continue all of your current medications.  If your symptoms worsen return for reevaluation or see your PCP.

## 2020-02-20 NOTE — ED Triage Notes (Signed)
Pt is here with a cough that started a week ago, pt has used her nebulizer and inhaler with OTC meds to relieve discomfort.

## 2020-07-21 ENCOUNTER — Encounter: Payer: Self-pay | Admitting: Emergency Medicine

## 2020-07-21 ENCOUNTER — Other Ambulatory Visit: Payer: Self-pay

## 2020-07-21 ENCOUNTER — Inpatient Hospital Stay
Admission: EM | Admit: 2020-07-21 | Discharge: 2020-07-24 | DRG: 190 | Disposition: A | Discharge status: Home / Self Care | Payer: 59 | Source: Ambulatory Visit | Attending: Internal Medicine | Admitting: Internal Medicine

## 2020-07-21 ENCOUNTER — Emergency Department: Payer: 59

## 2020-07-21 ENCOUNTER — Ambulatory Visit
Admission: EM | Admit: 2020-07-21 | Discharge: 2020-07-21 | Discharge status: Against Medical Advice | Payer: 59 | Attending: Sports Medicine | Admitting: Sports Medicine

## 2020-07-21 DIAGNOSIS — Z8249 Family history of ischemic heart disease and other diseases of the circulatory system: Secondary | ICD-10-CM

## 2020-07-21 DIAGNOSIS — Z7951 Long term (current) use of inhaled steroids: Secondary | ICD-10-CM | POA: Diagnosis not present

## 2020-07-21 DIAGNOSIS — R0602 Shortness of breath: Secondary | ICD-10-CM

## 2020-07-21 DIAGNOSIS — J441 Chronic obstructive pulmonary disease with (acute) exacerbation: Secondary | ICD-10-CM | POA: Diagnosis not present

## 2020-07-21 DIAGNOSIS — Z89022 Acquired absence of left finger(s): Secondary | ICD-10-CM | POA: Diagnosis not present

## 2020-07-21 DIAGNOSIS — K219 Gastro-esophageal reflux disease without esophagitis: Secondary | ICD-10-CM | POA: Diagnosis present

## 2020-07-21 DIAGNOSIS — Z7984 Long term (current) use of oral hypoglycemic drugs: Secondary | ICD-10-CM

## 2020-07-21 DIAGNOSIS — E119 Type 2 diabetes mellitus without complications: Secondary | ICD-10-CM

## 2020-07-21 DIAGNOSIS — Z20822 Contact with and (suspected) exposure to covid-19: Secondary | ICD-10-CM | POA: Diagnosis present

## 2020-07-21 DIAGNOSIS — Z79899 Other long term (current) drug therapy: Secondary | ICD-10-CM

## 2020-07-21 DIAGNOSIS — I10 Essential (primary) hypertension: Secondary | ICD-10-CM | POA: Diagnosis not present

## 2020-07-21 DIAGNOSIS — J9601 Acute respiratory failure with hypoxia: Secondary | ICD-10-CM | POA: Diagnosis not present

## 2020-07-21 DIAGNOSIS — Z87892 Personal history of anaphylaxis: Secondary | ICD-10-CM | POA: Diagnosis not present

## 2020-07-21 DIAGNOSIS — R062 Wheezing: Secondary | ICD-10-CM

## 2020-07-21 DIAGNOSIS — R059 Cough, unspecified: Secondary | ICD-10-CM

## 2020-07-21 DIAGNOSIS — J206 Acute bronchitis due to rhinovirus: Secondary | ICD-10-CM | POA: Diagnosis not present

## 2020-07-21 DIAGNOSIS — E1165 Type 2 diabetes mellitus with hyperglycemia: Secondary | ICD-10-CM | POA: Diagnosis present

## 2020-07-21 DIAGNOSIS — Z885 Allergy status to narcotic agent status: Secondary | ICD-10-CM | POA: Diagnosis not present

## 2020-07-21 DIAGNOSIS — J440 Chronic obstructive pulmonary disease with acute lower respiratory infection: Secondary | ICD-10-CM | POA: Diagnosis present

## 2020-07-21 DIAGNOSIS — E669 Obesity, unspecified: Secondary | ICD-10-CM | POA: Diagnosis present

## 2020-07-21 DIAGNOSIS — G4730 Sleep apnea, unspecified: Secondary | ICD-10-CM | POA: Diagnosis present

## 2020-07-21 DIAGNOSIS — Z825 Family history of asthma and other chronic lower respiratory diseases: Secondary | ICD-10-CM | POA: Diagnosis not present

## 2020-07-21 DIAGNOSIS — E785 Hyperlipidemia, unspecified: Secondary | ICD-10-CM | POA: Diagnosis not present

## 2020-07-21 DIAGNOSIS — R0902 Hypoxemia: Secondary | ICD-10-CM

## 2020-07-21 DIAGNOSIS — Z87891 Personal history of nicotine dependence: Secondary | ICD-10-CM

## 2020-07-21 LAB — COMPREHENSIVE METABOLIC PANEL
ALT: 26 U/L (ref 0–44)
AST: 18 U/L (ref 15–41)
Albumin: 3.9 g/dL (ref 3.5–5.0)
Alkaline Phosphatase: 70 U/L (ref 38–126)
Anion gap: 9 (ref 5–15)
BUN: 16 mg/dL (ref 6–20)
CO2: 24 mmol/L (ref 22–32)
Calcium: 9.4 mg/dL (ref 8.9–10.3)
Chloride: 106 mmol/L (ref 98–111)
Creatinine, Ser: 0.61 mg/dL (ref 0.44–1.00)
GFR, Estimated: >60 mL/min (ref >60)
Glucose, Bld: 122 mg/dL — ABNORMAL HIGH (ref 70–99)
Potassium: 3.7 mmol/L (ref 3.5–5.1)
Sodium: 139 mmol/L (ref 135–145)
Total Bilirubin: 0.8 mg/dL (ref 0.3–1.2)
Total Protein: 7.2 g/dL (ref 6.5–8.1)

## 2020-07-21 LAB — CBC WITH DIFFERENTIAL/PLATELET
Abs Immature Granulocytes: 0.02 10*3/uL (ref 0.00–0.07)
Basophils Absolute: 0.1 10*3/uL (ref 0.0–0.1)
Basophils Relative: 1 %
Eosinophils Absolute: 0.1 10*3/uL (ref 0.0–0.5)
Eosinophils Relative: 1 %
HCT: 48.4 % — ABNORMAL HIGH (ref 36.0–46.0)
Hemoglobin: 16 g/dL — ABNORMAL HIGH (ref 12.0–15.0)
Immature Granulocytes: 0 %
Lymphocytes Relative: 23 %
Lymphs Abs: 2.3 10*3/uL (ref 0.7–4.0)
MCH: 28.5 pg (ref 26.0–34.0)
MCHC: 33.1 g/dL (ref 30.0–36.0)
MCV: 86.3 fL (ref 80.0–100.0)
Monocytes Absolute: 0.5 10*3/uL (ref 0.1–1.0)
Monocytes Relative: 5 %
Neutro Abs: 6.8 10*3/uL (ref 1.7–7.7)
Neutrophils Relative %: 70 %
Platelets: 179 10*3/uL (ref 150–400)
RBC: 5.61 MIL/uL — ABNORMAL HIGH (ref 3.87–5.11)
RDW: 13.5 % (ref 11.5–15.5)
WBC: 9.8 10*3/uL (ref 4.0–10.5)
nRBC: 0 % (ref 0.0–0.2)

## 2020-07-21 LAB — RESP PANEL BY RT-PCR (FLU A&B, COVID) ARPGX2
Influenza A by PCR: NEGATIVE
Influenza B by PCR: NEGATIVE
SARS Coronavirus 2 by RT PCR: NEGATIVE

## 2020-07-21 LAB — BLOOD GAS, VENOUS
Acid-base deficit: 0 mmol/L (ref 0.0–2.0)
Bicarbonate: 25.4 mmol/L (ref 20.0–28.0)
O2 Saturation: 90.9 %
Patient temperature: 37
pCO2, Ven: 43 mmHg — ABNORMAL LOW (ref 44.0–60.0)
pH, Ven: 7.38 (ref 7.250–7.430)
pO2, Ven: 62 mmHg — ABNORMAL HIGH (ref 32.0–45.0)

## 2020-07-21 LAB — TROPONIN I (HIGH SENSITIVITY): Troponin I (High Sensitivity): 5 ng/L (ref <18)

## 2020-07-21 LAB — BRAIN NATRIURETIC PEPTIDE: B Natriuretic Peptide: 31.5 pg/mL (ref 0.0–100.0)

## 2020-07-21 MED ORDER — METHYLPREDNISOLONE SODIUM SUCC 125 MG IJ SOLR
125.0000 mg | Freq: Once | INTRAMUSCULAR | Status: AC
  Administered 2020-07-21: 125 mg via INTRAVENOUS
  Filled 2020-07-21: qty 2

## 2020-07-21 MED ORDER — ALBUTEROL SULFATE (2.5 MG/3ML) 0.083% IN NEBU
5.0000 mg | INHALATION_SOLUTION | Freq: Once | RESPIRATORY_TRACT | Status: AC
  Administered 2020-07-22: 5 mg via RESPIRATORY_TRACT
  Filled 2020-07-21: qty 6

## 2020-07-21 MED ORDER — IPRATROPIUM-ALBUTEROL 0.5-2.5 (3) MG/3ML IN SOLN
3.0000 mL | Freq: Once | RESPIRATORY_TRACT | Status: AC
  Administered 2020-07-21: 3 mL via RESPIRATORY_TRACT
  Filled 2020-07-21: qty 3

## 2020-07-21 NOTE — ED Provider Notes (Signed)
[facility] Emergency Department Provider Note  ____________________________________________   Event Date/Time   First MD Initiated Contact with Patient 07/21/20 2052     (approximate)  I have reviewed the triage vital signs and the nursing notes.   HISTORY  Chief Complaint Shortness of Breath    HPI Heather Nguyen is a 60 y.o. female with history of hypertension, diabetes, COPD, here with shortness of breath.  The patient states that for the last several days, she has had cough and wheezing.  Her husband has a current URI and they have both been dealing with cough and cold type symptoms.  Over the last 24 hours, the patient shortness of breath has acutely worsened.  She has been very dyspneic, initially with exertion but now at rest.  She has felt fatigued.  She has had some diffuse wheezing.  No sputum production.  No known fevers.  No abdominal pain, nausea, vomiting.  Her husband did test himself for COVID twice with 2 negative rapid test.  No leg swelling.  No known history of DVT or PE.  No chest pain.        Past Medical History:  Diagnosis Date  . Asthma   . Diabetes mellitus without complication ([place])   . Hypertension     Patient Active Problem List   Diagnosis Date Noted  . COPD exacerbation ([place]) 07/21/2020  . Asthma exacerbation 06/16/2015    Past Surgical History:  Procedure Laterality Date  . 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 Authorizing Provider  albuterol (PROVENTIL HFA;VENTOLIN HFA) 108 (90 Base) MCG/ACT inhaler Inhale 2 puffs into the lungs every 6 (six) hours as needed for wheezing or shortness of breath.    [provider]  ASMANEX, 30 METERED DOSES, 220 MCG/INH inhaler INHALE 1 INHALATION INTO THE LUNGS ONCE DAILY. 09/11/17   [provider]  atorvastatin (LIPITOR) 10 MG tablet Take 10 mg by mouth daily.  09/19/17   [provider]  benzonatate (TESSALON) 100 MG capsule Take 2 capsules (200 mg total) by mouth every 8 (eight) hours. 02/20/20   Margarette Canada, NP  cyclobenzaprine (FLEXERIL) 10 MG tablet Take 1 tablet (10 mg total) by mouth 3 (three) times daily as needed for muscle spasms. 10/12/17   Norval Gable, MD  doxycycline (VIBRAMYCIN) 100 MG capsule Take 1 capsule (100 mg total) by mouth 2 (two) times daily. 02/20/20   Margarette Canada, NP  Fluocinonide 0.1 % CREA Apply topically. 11/23/15   [provider]  Fluticasone-Salmeterol (ADVAIR DISKUS) 250-50 MCG/DOSE AEPB Inhale 1 puff into the lungs 2 (two) times daily. 06/18/15   Demetrios Loll, MD  ibuprofen (ADVIL,MOTRIN) 200 MG tablet Take 400 mg by mouth every 6 (six) hours as needed for headache or mild pain. Reported on 07/07/2015    [provider]  lisinopril (PRINIVIL,ZESTRIL) 5 MG tablet Take 5 mg by mouth daily.    [provider]  losartan (COZAAR) 100 MG tablet Take 100 mg by mouth daily. 02/08/20   [provider]  losartan (COZAAR) 25 MG tablet Take 25 mg by mouth daily. 09/11/17   [provider]  metFORMIN (GLUCOPHAGE-XR) 500 MG 24 hr tablet Take by mouth. 09/19/17   [provider]  methylPREDNISolone (MEDROL DOSEPAK) 4 MG TBPK tablet Take per package instructions 02/16/18   Crecencio Mc P, PA-C  montelukast (SINGULAIR) 10 MG  tablet Take by mouth. 09/17/17   [provider]  omeprazole (PRILOSEC) 40 MG capsule Take 40 mg by mouth daily. 02/08/20   [provider]  promethazine-dextromethorphan (PROMETHAZINE-DM) 6.25-15 MG/5ML syrup Take 5 mLs by mouth 4 (four) times daily as needed. Patient taking differently: Take 5 mLs by mouth 4 (four) times daily as needed. 02/20/20   Margarette Canada, NP  triamcinolone ointment (KENALOG) 0.1 % Apply topically. 02/16/20   [provider]    Allergies Codeine  Family History  Problem Relation Age of Onset  . CAD  Father   . Asthma Mother   . Breast cancer Cousin 24       pat cousin    Social History Social History   Tobacco Use  . Smoking status: Former Smoker    Packs/day: 1.00    Years: 35.00    Pack years: 35.00    Types: Cigarettes  . Smokeless tobacco: Never Used  Substance Use Topics  . Alcohol use: No    Alcohol/week: 0.0 standard drinks  . Drug use: No    Review of Systems  Review of Systems  Constitutional: Positive for fatigue. Negative for fever.  HENT: Negative for congestion and sore throat.   Eyes: Negative for visual disturbance.  Respiratory: Positive for cough, shortness of breath and wheezing.   Cardiovascular: Negative for chest pain.  Gastrointestinal: Negative for abdominal pain, diarrhea, nausea and vomiting.  Genitourinary: Negative for flank pain.  Musculoskeletal: Negative for back pain and neck pain.  Skin: Negative for rash and wound.  Neurological: Positive for weakness.  All other systems reviewed and are negative.    ____________________________________________  PHYSICAL EXAM:      VITAL SIGNS: ED Triage Vitals [07/21/20 2013]  Enc Vitals Group     BP (!) 172/88     Pulse Rate (!) 102     Resp 20     Temp 98.3 F (36.8 C)     Temp Source Oral     SpO2 (!) 84 %     Weight 220 lb (99.8 kg)     Height 5\' 1"  (1.549 m)     Head Circumference      Peak Flow      Pain Score 0     Pain Loc      Pain Edu?      Excl. in [place]?      Physical Exam Vitals and nursing note reviewed.  Constitutional:      General: She is not in acute distress.    Appearance: She is well-developed.  HENT:     Head: Normocephalic and atraumatic.  Eyes:     Conjunctiva/sclera: Conjunctivae normal.  Cardiovascular:     Rate and Rhythm: Regular rhythm. Tachycardia present.     Heart sounds: Normal heart sounds. No murmur heard. No friction rub.  Pulmonary:     Effort: Pulmonary effort is normal. Tachypnea present. No respiratory distress.     Breath sounds:  Examination of the right-upper field reveals wheezing. Examination of the left-upper field reveals wheezing. Examination of the right-middle field reveals wheezing. Examination of the left-middle field reveals wheezing. Examination of the right-lower field reveals wheezing. Examination of the left-lower field reveals wheezing. Decreased breath sounds and wheezing present. No rales.  Abdominal:     General: There is no distension.     Palpations: Abdomen is soft.     Tenderness: There is no abdominal tenderness.  Musculoskeletal:     Cervical back: Neck supple.  Right lower leg: No edema.     Left lower leg: No edema.  Skin:    General: Skin is warm.     Capillary Refill: Capillary refill takes less than 2 seconds.  Neurological:     Mental Status: She is alert and oriented to person, place, and time.     Motor: No abnormal muscle tone.       ____________________________________________   LABS (all labs ordered are listed, but only abnormal results are displayed)  Labs Reviewed  CBC WITH DIFFERENTIAL/PLATELET - Abnormal; Notable for the following components:      Result Value   RBC 5.61 (*)    Hemoglobin 16.0 (*)    HCT 48.4 (*)    All other components within normal limits  COMPREHENSIVE METABOLIC PANEL - Abnormal; Notable for the following components:   Glucose, Bld 122 (*)    All other components within normal limits  BLOOD GAS, VENOUS - Abnormal; Notable for the following components:   pCO2, Ven 43 (*)    pO2, Ven 62.0 (*)    All other components within normal limits  RESP PANEL BY RT-PCR (FLU A&B, COVID) ARPGX2  CULTURE, BLOOD (ROUTINE X 2)  CULTURE, BLOOD (ROUTINE X 2)  EXPECTORATED SPUTUM ASSESSMENT W GRAM STAIN, RFLX TO RESP C  BRAIN NATRIURETIC PEPTIDE  HIV ANTIBODY (ROUTINE TESTING W REFLEX)  BASIC METABOLIC PANEL  CBC  HEMOGLOBIN A1C  TROPONIN I (HIGH SENSITIVITY)    ____________________________________________  EKG: Sinus tachycardia, ventricular 101.   PR 178, QRS 80, QTc 456.  No acute ST elevation or depression.  No acute evidence of acute ischemia or infarct. ________________________________________  RADIOLOGY All imaging, including plain films, CT scans, and ultrasounds, independently reviewed by me, and interpretations confirmed via formal radiology reads.  ED MD interpretation:   CXR: Clear  Official radiology report(s): DG Chest 1 View  Result Date: 07/21/2020 CLINICAL DATA:  Fall. EXAM: CHEST  1 VIEW COMPARISON:  February 20, 2020. FINDINGS: The heart size and mediastinal contours are within normal limits. Both lungs are clear. The visualized skeletal structures are unremarkable. IMPRESSION: No active disease. Electronically Signed   By: Marijo Conception M.D.   On: 07/21/2020 20:38    ____________________________________________  PROCEDURES   Procedure(s) performed (including Critical Care):  .Critical Care Performed by: Duffy Bruce, MD Authorized by: Duffy Bruce, MD   Critical care provider statement:    Critical care time (minutes):  35   Critical care time was exclusive of:  Separately billable procedures and treating other patients and teaching time   Critical care was necessary to treat or prevent imminent or life-threatening deterioration of the following conditions:  Cardiac failure, circulatory failure and respiratory failure   Critical care was time spent personally by me on the following activities:  Development of treatment plan with patient or surrogate, discussions with consultants, evaluation of patient's response to treatment, examination of patient, obtaining history from patient or surrogate, ordering and performing treatments and interventions, ordering and review of laboratory studies, ordering and review of radiographic studies, pulse oximetry, re-evaluation of patient's condition and review of old charts   I assumed direction of critical care for this patient from another provider in my specialty:  no      ____________________________________________  INITIAL IMPRESSION / MDM / [place] / ED COURSE  As part of my medical decision making, I reviewed the following data within the [place] notes reviewed and incorporated, Old chart reviewed, Notes from  prior ED visits, and [place] Controlled Substance Database       *ENGIE PERLSTEIN was evaluated in Emergency Department on 07/22/2020 for the symptoms described in the history of present illness. She was evaluated in the context of the global COVID-19 pandemic, which necessitated consideration that the patient might be at risk for infection with the SARS-CoV-2 virus that causes COVID-19. Institutional protocols and algorithms that pertain to the evaluation of patients at risk for COVID-19 are in a state of rapid change based on information released by regulatory bodies including the CDC and federal and state organizations. These policies and algorithms were followed during the patient's care in the ED.  Some ED evaluations and interventions may be delayed as a result of limited staffing during the pandemic.*     Medical Decision Making:  60 yo F here with cough, wheezing, SOB. On arrival, pt hypoxic, requiring 4-5L [place] with diffuse wheezing on exam. CXR reviewed, shows no PNA or PTX. VBG without hypercapnia. CBC, CMP largely unremarkable. EKG nonischemic and trop, BNP negative. COVID negative. Pt given nebs, steroids with improvement in WOB/RR but worsening of hypoxia. Switched to HFNC with improvement in sats. No LE edema or signs of DVT/PE clinically.  Suspect COPD exacerbation with hypoxia. Will admit to step down given her O2 requirement.    ____________________________________________  FINAL CLINICAL IMPRESSION(S) / ED DIAGNOSES  Final diagnoses:  Acute respiratory failure with hypoxia (HCC)  COPD exacerbation (HCC)     MEDICATIONS GIVEN DURING THIS VISIT:  Medications  atorvastatin (LIPITOR)  tablet 10 mg (has no administration in time range)  lisinopril (ZESTRIL) tablet 5 mg (has no administration in time range)  losartan (COZAAR) tablet 100 mg (has no administration in time range)  losartan (COZAAR) tablet 25 mg (has no administration in time range)  pantoprazole (PROTONIX) EC tablet 40 mg (has no administration in time range)  mometasone (ASMANEX) inhaler 2 puff (has no administration in time range)  benzonatate (TESSALON) capsule 200 mg (has no administration in time range)  montelukast (SINGULAIR) tablet 10 mg (has no administration in time range)  enoxaparin (LOVENOX) injection 50 mg (has no administration in time range)  0.9 %  sodium chloride infusion (has no administration in time range)  acetaminophen (TYLENOL) tablet 650 mg (has no administration in time range)    Or  acetaminophen (TYLENOL) suppository 650 mg (has no administration in time range)  traZODone (DESYREL) tablet 25 mg (has no administration in time range)  magnesium hydroxide (MILK OF MAGNESIA) suspension 30 mL (has no administration in time range)  ondansetron (ZOFRAN) tablet 4 mg (has no administration in time range)    Or  ondansetron (ZOFRAN) injection 4 mg (has no administration in time range)  cefTRIAXone (ROCEPHIN) 2 g in sodium chloride 0.9 % 100 mL IVPB (has no administration in time range)  methylPREDNISolone sodium succinate (SOLU-MEDROL) 40 mg/mL injection 40 mg (has no administration in time range)    Followed by  predniSONE (DELTASONE) tablet 40 mg (has no administration in time range)  guaiFENesin (MUCINEX) 12 hr tablet 600 mg (has no administration in time range)  dextromethorphan-guaiFENesin (MUCINEX DM) 30-600 MG per 12 hr tablet 1 tablet (has no administration in time range)  labetalol (NORMODYNE) injection 20 mg (has no administration in time range)  insulin aspart (novoLOG) injection 0-15 Units (has no administration in time range)  ipratropium-albuterol (DUONEB) 0.5-2.5 (3) MG/3ML  nebulizer solution 3 mL (has no administration in time range)    Or  ipratropium-albuterol (DUONEB) 0.5-2.5 (  3) MG/3ML nebulizer solution 3 mL (has no administration in time range)  ipratropium-albuterol (DUONEB) 0.5-2.5 (3) MG/3ML nebulizer solution 3 mL (3 mLs Nebulization Given 07/21/20 2128)  ipratropium-albuterol (DUONEB) 0.5-2.5 (3) MG/3ML nebulizer solution 3 mL (3 mLs Nebulization Given 07/21/20 2128)  ipratropium-albuterol (DUONEB) 0.5-2.5 (3) MG/3ML nebulizer solution 3 mL (3 mLs Nebulization Given 07/21/20 2127)  methylPREDNISolone sodium succinate (SOLU-MEDROL) 125 mg/2 mL injection 125 mg (125 mg Intravenous Given 07/21/20 2134)  albuterol (PROVENTIL) (2.5 MG/3ML) 0.083% nebulizer solution 5 mg (5 mg Nebulization Given 07/22/20 0000)     ED Discharge Orders    None       Note:  This document was prepared using Dragon voice recognition software and may include unintentional dictation errors.   Duffy Bruce, MD 07/22/20 [phone]

## 2020-07-21 NOTE — ED Notes (Signed)
RT at bedside to set up heated high flow. VBG at bedside, RT aware.

## 2020-07-21 NOTE — Discharge Instructions (Addendum)
My recommendation is that you go to the emergency room via EMS for shortness of breath, oxygen saturation of 80%, and impending respiratory failure.  He refused and wanted to go by your personal vehicle.  He signed out [place].  Please go directly to the emergency room.

## 2020-07-21 NOTE — ED Notes (Signed)
Following breathing treatment, pt reports improvement in symptoms. However, pt now sating 87% on 3L. Unable to increase oxygen above 87% on 6L [place], placed on non-rebreather. Able to get to 91% with 15L NR[facility]. Dr. Ellender Hose notified. Pt continues to remain A&Ox4, denies pain, states "I really feel better." Dr. Ellender Hose discussing bipap vs high flow with RT.

## 2020-07-21 NOTE — ED Triage Notes (Signed)
Patient c/o cough and shortness of breath that worsened on Monday. On Room air her oxygen sats were 80% when she presented to the urgent care. Patient was placed on 4L oxygen via [place]. Patient improved to 90% on 4L. Increased to 91% on 6L.

## 2020-07-21 NOTE — ED Notes (Signed)
Patient was advised by Dr. Drema Dallas and this RN to be transported to [facility] via EMS. Patient refused and stated she wanted her husband to take her to the ER. Husband was brought back to the patients room and the risks were discussed with him of patient leaving AMA. Both patient and husband verbalized understanding. Patient signed out AMA.

## 2020-07-21 NOTE — ED Triage Notes (Signed)
Pt in with co shob hx of bronchitis and copd states feels the same. Went to urgent care and was sent here. Pt not oxygen dependent at home, no  Distress at this time.

## 2020-07-22 ENCOUNTER — Encounter: Payer: Self-pay | Admitting: Family Medicine

## 2020-07-22 DIAGNOSIS — J9601 Acute respiratory failure with hypoxia: Secondary | ICD-10-CM

## 2020-07-22 LAB — CBC
HCT: 47.7 % — ABNORMAL HIGH (ref 36.0–46.0)
Hemoglobin: 15.5 g/dL — ABNORMAL HIGH (ref 12.0–15.0)
MCH: 28.1 pg (ref 26.0–34.0)
MCHC: 32.5 g/dL (ref 30.0–36.0)
MCV: 86.6 fL (ref 80.0–100.0)
Platelets: 178 10*3/uL (ref 150–400)
RBC: 5.51 MIL/uL — ABNORMAL HIGH (ref 3.87–5.11)
RDW: 13.4 % (ref 11.5–15.5)
WBC: 8.3 10*3/uL (ref 4.0–10.5)
nRBC: 0 % (ref 0.0–0.2)

## 2020-07-22 LAB — RESPIRATORY PANEL BY PCR

## 2020-07-22 LAB — HIV ANTIBODY (ROUTINE TESTING W REFLEX): HIV Screen 4th Generation wRfx: NONREACTIVE

## 2020-07-22 LAB — BASIC METABOLIC PANEL
Anion gap: 11 (ref 5–15)
BUN: 19 mg/dL (ref 6–20)
CO2: 21 mmol/L — ABNORMAL LOW (ref 22–32)
Calcium: 9.1 mg/dL (ref 8.9–10.3)
Chloride: 104 mmol/L (ref 98–111)
Creatinine, Ser: 0.74 mg/dL (ref 0.44–1.00)
GFR, Estimated: >60 mL/min (ref >60)
Glucose, Bld: 218 mg/dL — ABNORMAL HIGH (ref 70–99)
Potassium: 3.4 mmol/L — ABNORMAL LOW (ref 3.5–5.1)
Sodium: 136 mmol/L (ref 135–145)

## 2020-07-22 LAB — PROCALCITONIN: Procalcitonin: <0.1 ng/mL

## 2020-07-22 LAB — HEMOGLOBIN A1C
Hgb A1c MFr Bld: 6.4 % — ABNORMAL HIGH (ref 4.8–5.6)
Mean Plasma Glucose: 136.98 mg/dL

## 2020-07-22 LAB — GLUCOSE, CAPILLARY
Glucose-Capillary: 200 mg/dL — ABNORMAL HIGH (ref 70–99)
Glucose-Capillary: 172 mg/dL — ABNORMAL HIGH (ref 70–99)
Glucose-Capillary: 188 mg/dL — ABNORMAL HIGH (ref 70–99)
Glucose-Capillary: 209 mg/dL — ABNORMAL HIGH (ref 70–99)

## 2020-07-22 LAB — MRSA PCR SCREENING: MRSA by PCR: NEGATIVE

## 2020-07-22 MED ORDER — MOMETASONE FUROATE 220 MCG/INH IN AEPB: 2.0000 | INHALATION_SPRAY | Freq: Two times a day (BID) | RESPIRATORY_TRACT | Status: DC

## 2020-07-22 MED ORDER — MONTELUKAST SODIUM 10 MG PO TABS
10.0000 mg | ORAL_TABLET | Freq: Every day | ORAL | Status: DC
  Administered 2020-07-22 – 2020-07-23 (×2): 10 mg via ORAL
  Filled 2020-07-22 (×2): qty 1

## 2020-07-22 MED ORDER — SODIUM CHLORIDE 0.9 % IV SOLN
2.0000 g | INTRAVENOUS | Status: DC
  Administered 2020-07-22: 2 g via INTRAVENOUS
  Filled 2020-07-22 (×2): qty 20

## 2020-07-22 MED ORDER — IPRATROPIUM-ALBUTEROL 0.5-2.5 (3) MG/3ML IN SOLN
3.0000 mL | RESPIRATORY_TRACT | Status: DC | PRN
  Administered 2020-07-22 (×3): 3 mL via RESPIRATORY_TRACT
  Filled 2020-07-22: qty 9
  Filled 2020-07-22: qty 3

## 2020-07-22 MED ORDER — SODIUM CHLORIDE 0.9 % IV SOLN: INTRAVENOUS | Status: DC

## 2020-07-22 MED ORDER — PANTOPRAZOLE SODIUM 40 MG PO TBEC
40.0000 mg | DELAYED_RELEASE_TABLET | Freq: Every day | ORAL | Status: DC
  Administered 2020-07-22 – 2020-07-24 (×3): 40 mg via ORAL
  Filled 2020-07-22 (×3): qty 1

## 2020-07-22 MED ORDER — ACETAMINOPHEN 325 MG PO TABS
650.0000 mg | ORAL_TABLET | Freq: Four times a day (QID) | ORAL | Status: DC | PRN
  Administered 2020-07-23: 650 mg via ORAL
  Filled 2020-07-22: qty 2

## 2020-07-22 MED ORDER — LOSARTAN POTASSIUM 50 MG PO TABS
100.0000 mg | ORAL_TABLET | Freq: Every day | ORAL | Status: DC
  Administered 2020-07-22 – 2020-07-24 (×3): 100 mg via ORAL
  Filled 2020-07-22 (×3): qty 2

## 2020-07-22 MED ORDER — DM-GUAIFENESIN ER 30-600 MG PO TB12: 1.0000 | ORAL_TABLET | Freq: Two times a day (BID) | ORAL | Status: DC | PRN

## 2020-07-22 MED ORDER — BLISTEX MEDICATED EX OINT
TOPICAL_OINTMENT | CUTANEOUS | Status: DC | PRN
  Administered 2020-07-22: 1 via TOPICAL
  Filled 2020-07-22: qty 6.3

## 2020-07-22 MED ORDER — BENZONATATE 100 MG PO CAPS: 200.0000 mg | ORAL_CAPSULE | Freq: Three times a day (TID) | ORAL | Status: DC

## 2020-07-22 MED ORDER — METHYLPREDNISOLONE SODIUM SUCC 40 MG IJ SOLR
40.0000 mg | Freq: Four times a day (QID) | INTRAMUSCULAR | Status: AC
  Administered 2020-07-22 (×4): 40 mg via INTRAVENOUS
  Filled 2020-07-22 (×4): qty 1

## 2020-07-22 MED ORDER — CHLORHEXIDINE GLUCONATE CLOTH 2 % EX PADS
6.0000 | MEDICATED_PAD | Freq: Every day | CUTANEOUS | Status: DC
  Administered 2020-07-22 – 2020-07-24 (×2): 6 via TOPICAL

## 2020-07-22 MED ORDER — ACETAMINOPHEN 650 MG RE SUPP: 650.0000 mg | Freq: Four times a day (QID) | RECTAL | Status: DC | PRN

## 2020-07-22 MED ORDER — MOMETASONE FURO-FORMOTEROL FUM 200-5 MCG/ACT IN AERO
2.0000 | INHALATION_SPRAY | Freq: Two times a day (BID) | RESPIRATORY_TRACT | Status: DC
  Administered 2020-07-22 – 2020-07-24 (×5): 2 via RESPIRATORY_TRACT
  Filled 2020-07-22: qty 8.8

## 2020-07-22 MED ORDER — ATORVASTATIN CALCIUM 20 MG PO TABS
10.0000 mg | ORAL_TABLET | Freq: Every day | ORAL | Status: DC
  Administered 2020-07-22 – 2020-07-24 (×3): 10 mg via ORAL
  Filled 2020-07-22 (×3): qty 1

## 2020-07-22 MED ORDER — ENOXAPARIN SODIUM 60 MG/0.6ML IJ SOSY
0.5000 mg/kg | PREFILLED_SYRINGE | INTRAMUSCULAR | Status: DC
  Administered 2020-07-22 – 2020-07-23 (×2): 50 mg via SUBCUTANEOUS
  Filled 2020-07-22 (×2): qty 0.5

## 2020-07-22 MED ORDER — IPRATROPIUM-ALBUTEROL 0.5-2.5 (3) MG/3ML IN SOLN: 3.0000 mL | Freq: Four times a day (QID) | RESPIRATORY_TRACT | Status: DC

## 2020-07-22 MED ORDER — TRAZODONE HCL 50 MG PO TABS: 25.0000 mg | ORAL_TABLET | Freq: Every evening | ORAL | Status: DC | PRN

## 2020-07-22 MED ORDER — PREDNISONE 20 MG PO TABS
40.0000 mg | ORAL_TABLET | Freq: Every day | ORAL | Status: DC
  Administered 2020-07-23 – 2020-07-24 (×2): 40 mg via ORAL
  Filled 2020-07-22: qty 2
  Filled 2020-07-22: qty 4

## 2020-07-22 MED ORDER — ONDANSETRON HCL 4 MG PO TABS: 4.0000 mg | ORAL_TABLET | Freq: Four times a day (QID) | ORAL | Status: DC | PRN

## 2020-07-22 MED ORDER — INSULIN ASPART 100 UNIT/ML IJ SOLN
0.0000 [IU] | Freq: Three times a day (TID) | INTRAMUSCULAR | Status: DC
  Administered 2020-07-22 (×2): 3 [IU] via SUBCUTANEOUS
  Administered 2020-07-22: 5 [IU] via SUBCUTANEOUS
  Administered 2020-07-22 – 2020-07-23 (×4): 3 [IU] via SUBCUTANEOUS
  Filled 2020-07-22 (×7): qty 1

## 2020-07-22 MED ORDER — GUAIFENESIN ER 600 MG PO TB12
600.0000 mg | ORAL_TABLET | Freq: Two times a day (BID) | ORAL | Status: DC
  Administered 2020-07-22 – 2020-07-24 (×6): 600 mg via ORAL
  Filled 2020-07-22 (×6): qty 1

## 2020-07-22 MED ORDER — LOSARTAN POTASSIUM 50 MG PO TABS: 25.0000 mg | ORAL_TABLET | Freq: Every day | ORAL | Status: DC

## 2020-07-22 MED ORDER — IPRATROPIUM-ALBUTEROL 0.5-2.5 (3) MG/3ML IN SOLN: 3.0000 mL | RESPIRATORY_TRACT | Status: DC | PRN

## 2020-07-22 MED ORDER — AZITHROMYCIN 500 MG PO TABS
500.0000 mg | ORAL_TABLET | Freq: Every day | ORAL | Status: AC
  Administered 2020-07-22: 500 mg via ORAL
  Filled 2020-07-22: qty 1

## 2020-07-22 MED ORDER — GUAIFENESIN ER 600 MG PO TB12: 600.0000 mg | ORAL_TABLET | Freq: Two times a day (BID) | ORAL | Status: DC

## 2020-07-22 MED ORDER — MAGNESIUM HYDROXIDE 400 MG/5ML PO SUSP
30.0000 mL | Freq: Every day | ORAL | Status: DC | PRN
  Filled 2020-07-22: qty 30

## 2020-07-22 MED ORDER — LISINOPRIL 5 MG PO TABS: 5.0000 mg | ORAL_TABLET | Freq: Every day | ORAL | Status: DC

## 2020-07-22 MED ORDER — ONDANSETRON HCL 4 MG/2ML IJ SOLN: 4.0000 mg | Freq: Four times a day (QID) | INTRAMUSCULAR | Status: DC | PRN

## 2020-07-22 MED ORDER — LABETALOL HCL 5 MG/ML IV SOLN
20.0000 mg | INTRAVENOUS | Status: DC | PRN
  Administered 2020-07-22 – 2020-07-24 (×2): 20 mg via INTRAVENOUS
  Filled 2020-07-22 (×2): qty 4

## 2020-07-22 MED ORDER — AZITHROMYCIN 500 MG PO TABS
250.0000 mg | ORAL_TABLET | Freq: Every day | ORAL | Status: DC
  Administered 2020-07-23 – 2020-07-24 (×2): 250 mg via ORAL
  Filled 2020-07-22 (×2): qty 1

## 2020-07-22 NOTE — ED Provider Notes (Signed)
[facility]    CSN: [id number] Arrival date & time: 07/21/20  1948      History   Chief Complaint Chief Complaint  Patient presents with  . Shortness of Breath    HPI Heather Nguyen is a 60 y.o. female.    Duplicate note created in error.  Please see note from same day.  Past Medical History:  Diagnosis Date  . Asthma   . Diabetes mellitus without complication ([place])   . Hypertension     Patient Active Problem List   Diagnosis Date Noted  . Acute respiratory failure with hypoxia ([place])   . COPD exacerbation ([place]) 07/21/2020  . Asthma exacerbation 06/16/2015    Past Surgical History:  Procedure Laterality Date  . 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 Authorizing Provider  albuterol (PROVENTIL HFA;VENTOLIN HFA) 108 (90 Base) MCG/ACT inhaler Inhale 2 puffs into the lungs every 6 (six) hours as needed for wheezing or shortness of breath.   Yes [provider]  atorvastatin (LIPITOR) 40 MG tablet Take 40 mg by mouth daily. 09/19/17  Yes [provider]  Fluticasone-Salmeterol (ADVAIR DISKUS) 250-50 MCG/DOSE AEPB Inhale 1 puff into the lungs 2 (two) times daily. 06/18/15  Yes Demetrios Loll, MD  ibuprofen (ADVIL,MOTRIN) 200 MG tablet Take 400 mg by mouth every 6 (six) hours as needed for headache or mild pain. Reported on 07/07/2015   Yes [provider]  loratadine (CLARITIN) 10 MG tablet Take 10 mg by mouth daily. 06/02/20  Yes [provider]  losartan (COZAAR) 100 MG tablet Take 100 mg by mouth daily. 02/08/20  Yes [provider]  metFORMIN (GLUCOPHAGE-XR) 500 MG 24 hr tablet Take 500 mg by mouth 2 (two) times daily. 09/19/17   Yes [provider]  montelukast (SINGULAIR) 10 MG tablet Take 10 mg by mouth at bedtime. 09/17/17  Yes [provider]  nicotine (NICODERM CQ - DOSED IN MG/24 HR) 7 mg/24hr patch Place 1 patch onto the skin daily. 02/05/19  Yes [provider]  omeprazole (PRILOSEC) 40 MG capsule Take 40 mg by mouth daily. 02/08/20  Yes [provider]  triamcinolone ointment (KENALOG) 0.1 % Apply topically. 02/16/20  Yes [provider]  ASMANEX, 30 METERED DOSES, 220 MCG/INH inhaler INHALE 1 INHALATION INTO THE LUNGS ONCE DAILY. Patient not taking: Reported on 07/22/2020 09/11/17   [provider]  benzonatate (TESSALON) 100 MG capsule Take 2 capsules (200 mg total) by mouth every 8 (eight) hours. Patient not taking: Reported on 07/22/2020 02/20/20   Margarette Canada, NP  cyclobenzaprine (FLEXERIL) 10 MG tablet Take 1 tablet (10 mg total) by mouth 3 (three) times daily as needed for muscle spasms. Patient not taking: Reported on 07/22/2020 10/12/17   Norval Gable, MD  doxycycline (VIBRAMYCIN) 100 MG capsule Take 1 capsule (100 mg total) by mouth 2 (two) times daily. Patient not taking: Reported on 07/22/2020 02/20/20  Margarette Canada, NP  Fluocinonide 0.1 % CREA Apply topically. Patient not taking: Reported on 07/22/2020 11/23/15   [provider]  lisinopril (PRINIVIL,ZESTRIL) 5 MG tablet Take 5 mg by mouth daily. Patient not taking: Reported on 07/22/2020    [provider]  losartan (COZAAR) 25 MG tablet Take 25 mg by mouth daily. Patient not taking: Reported on 07/22/2020 09/11/17   [provider]  methylPREDNISolone (MEDROL DOSEPAK) 4 MG TBPK tablet Take per package instructions Patient not taking: Reported on 07/22/2020 02/16/18   Lorin Picket, PA-C  promethazine-dextromethorphan (PROMETHAZINE-DM) 6.25-15 MG/5ML syrup Take 5 mLs by mouth 4 (four) times daily as needed. Patient not taking: Reported on 07/22/2020 02/20/20   Margarette Canada,  NP    Family History Family History  Problem Relation Age of Onset  . CAD Father   . Asthma Mother   . Breast cancer Cousin 1       pat cousin    Social History Social History   Tobacco Use  . Smoking status: Former Smoker    Packs/day: 1.00    Years: 35.00    Pack years: 35.00    Types: Cigarettes  . Smokeless tobacco: Never Used  Substance Use Topics  . Alcohol use: No    Alcohol/week: 0.0 standard drinks  . Drug use: No     Allergies   Codeine   Review of Systems Review of Systems     Physical Exam Triage Vital Signs ED Triage Vitals [07/21/20 2013]  Enc Vitals Group     BP (!) 172/88     Pulse Rate (!) 102     Resp 20     Temp 98.3 F (36.8 C)     Temp Source Oral     SpO2 (!) 84 %     Weight 220 lb (99.8 kg)     Height 5\' 1"  (1.549 m)     Head Circumference      Peak Flow      Pain Score 0     Pain Loc      Pain Edu?      Excl. in [place]?    No data found.  Updated Vital Signs BP (!) 160/92 (BP Location: Left Arm)   Pulse (!) 109   Temp 98.1 F (36.7 C) (Oral)   Resp (!) 26   Ht 5\' 1"  (1.549 m)   Wt 104.3 kg   SpO2 93%   BMI 43.45 kg/m   Visual Acuity Right Eye Distance:   Left Eye Distance:   Bilateral Distance:    Right Eye Near:   Left Eye Near:    Bilateral Near:     Physical Exam   UC Treatments / Results  Labs (all labs ordered are listed, but only abnormal results are displayed)   EKG   Radiology   Procedures Procedures (including critical care time)  Medications Ordered in UC Medications  atorvastatin (LIPITOR) tablet 10 mg (10 mg Oral Given 07/22/20 0941)  losartan (COZAAR) tablet 100 mg (100 mg Oral Given 07/22/20 0941)  pantoprazole (PROTONIX) EC tablet 40 mg (40 mg Oral Given 07/22/20 0941)  montelukast (SINGULAIR) tablet 10 mg (has no administration in time range)  enoxaparin (LOVENOX) injection 50 mg (50 mg Subcutaneous Given 07/22/20 1226)  acetaminophen (TYLENOL) tablet 650 mg (has no administration  in time range)    Or  acetaminophen (TYLENOL) suppository 650 mg (has no administration in time range)  traZODone (DESYREL) tablet 25 mg (has no administration in time  range)  magnesium hydroxide (MILK OF MAGNESIA) suspension 30 mL (has no administration in time range)  ondansetron (ZOFRAN) tablet 4 mg (has no administration in time range)    Or  ondansetron (ZOFRAN) injection 4 mg (has no administration in time range)  methylPREDNISolone sodium succinate (SOLU-MEDROL) 40 mg/mL injection 40 mg (40 mg Intravenous Given 07/22/20 0942)    Followed by  predniSONE (DELTASONE) tablet 40 mg (has no administration in time range)  guaiFENesin (MUCINEX) 12 hr tablet 600 mg (600 mg Oral Given 07/22/20 0941)  dextromethorphan-guaiFENesin ([place] DM) 30-600 MG per 12 hr tablet 1 tablet (has no administration in time range)  labetalol (NORMODYNE) injection 20 mg (has no administration in time range)  insulin aspart (novoLOG) injection 0-15 Units (3 Units Subcutaneous Given 07/22/20 1225)  ipratropium-albuterol (DUONEB) 0.5-2.5 (3) MG/3ML nebulizer solution 3 mL (3 mLs Nebulization Given 07/22/20 1435)  Chlorhexidine Gluconate Cloth 2 % PADS 6 each (6 each Topical Given 07/22/20 0930)  mometasone-formoterol (DULERA) 200-5 MCG/ACT inhaler 2 puff (2 puffs Inhalation Given 07/22/20 1226)  azithromycin (ZITHROMAX) tablet 500 mg (has no administration in time range)    Followed by  azithromycin (ZITHROMAX) tablet 250 mg (has no administration in time range)  ipratropium-albuterol (DUONEB) 0.5-2.5 (3) MG/3ML nebulizer solution 3 mL (3 mLs Nebulization Given 07/21/20 2128)  ipratropium-albuterol (DUONEB) 0.5-2.5 (3) MG/3ML nebulizer solution 3 mL (3 mLs Nebulization Given 07/21/20 2128)  ipratropium-albuterol (DUONEB) 0.5-2.5 (3) MG/3ML nebulizer solution 3 mL (3 mLs Nebulization Given 07/21/20 2127)  methylPREDNISolone sodium succinate (SOLU-MEDROL) 125 mg/2 mL injection 125 mg (125 mg Intravenous Given 07/21/20 2134)   albuterol (PROVENTIL) (2.5 MG/3ML) 0.083% nebulizer solution 5 mg (5 mg Nebulization Given 07/22/20 0000)    Initial Impression / Assessment and Plan / UC Course  I have reviewed the triage vital signs and the nursing notes.  Pertinent labs & imaging results that were available during my care of the patient were reviewed by me and considered in my medical decision making (see chart for details).     Final Clinical Impressions(s) / UC Diagnoses   Final diagnoses:  Acute respiratory failure with hypoxia (HCC)  COPD exacerbation ([place])   Discharge Instructions   None    ED Prescriptions    None     PDMP not reviewed this encounter.   Verda Cumins, MD 07/24/20 [phone]

## 2020-07-22 NOTE — Discharge Instructions (Addendum)
Use your inhalers and oxygen as directed

## 2020-07-22 NOTE — H&P (Signed)
[place]   PATIENT NAME: Heather Nguyen    MR#:  [id number]  DATE OF BIRTH:  Apr 29, 1960  DATE OF ADMISSION:  07/21/2020  PRIMARY CARE PHYSICIAN: Sallee Lange, NP   Patient is coming from: Home.  REQUESTING/REFERRING PHYSICIAN: Duffy Bruce, MD CHIEF COMPLAINT:   Chief Complaint  Patient presents with  . Shortness of Breath    HISTORY OF PRESENT ILLNESS:  Heather Nguyen is a 60 y.o. Caucasian female with medical history significant for asthma/COPD, type 2 diabetes mellitus and hypertension, who presented to the emergency room with acute onset of worsening dyspnea with associated cough productive of clear sputum and wheezing for the last 3 to 4 days.  She denies any fever or chills.  No chest pain or palpitations.  No nausea or vomiting or abdominal pain.  She denies any hemoptysis or other bleeding diathesis.  No dysuria, degree of hematuria or flank pain.  ED Course: When she came to the ER blood pressure was 164/88 with respiratory to 24 and pulse oximetry was 80% on room air 91% on 6 L O2 via nasal cannula then the patient was placed on high flow nasal cannula at 10 L with pulse oximetry currently improving to 94%.  Labs revealed VBG with pH 7.38, HCO3 of 25.4 and PaCO2 43 with PO2 of 62.  CMP was unremarkable.  High-sensitivity troponin I was 5 and BNP 31.5.  CBC showed hemoconcentration.  Influenza antigens and COVID-19 PCR came back negative.  EKG as reviewed by me : Sinus tachycardia with rate 101.  Imaging: Portable chest x-ray with no acute cardiopulmonary disease.  The patient was given duo nebs x3, 125 mg of IV Solu-Medrol and continuous nebulized albuterol.  She will be admitted to the stepdown unit bed for further evaluation and management. PAST MEDICAL HISTORY:   Past Medical History:  Diagnosis Date  . Asthma   . Diabetes mellitus without complication ([place])   . Hypertension   -COPD  PAST SURGICAL HISTORY:   Past Surgical History:   Procedure Laterality Date  . 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 Smoking status: Former Smoker    Packs/day: 1.00    Years: 35.00    Pack years: 35.00    Types: Cigarettes  . Smokeless tobacco: Never Used  Substance Use Topics  . Alcohol use: No    Alcohol/week: 0.0 standard drinks  She quit smoking cigarettes 2 to 3 years ago.  FAMILY HISTORY:   Family History  Problem Relation Age of Onset  . CAD Father   . Asthma Mother   . Breast cancer Cousin 4       pat cousin    DRUG ALLERGIES:   Allergies  Allergen Reactions  . Codeine Anaphylaxis and Swelling    REVIEW OF SYSTEMS:   ROS As per history of present illness. All pertinent systems were reviewed above. Constitutional, HEENT, cardiovascular, respiratory, GI, GU, musculoskeletal, neuro, psychiatric, endocrine, integumentary and hematologic systems were reviewed and are otherwise negative/unremarkable except for positive findings mentioned above in the HPI.   MEDICATIONS AT HOME:   Prior to Admission medications   Medication Sig Start Date End Date Taking? Authorizing Provider  albuterol (PROVENTIL HFA;VENTOLIN HFA) 108 (90 Base) MCG/ACT inhaler Inhale 2 puffs into the lungs every 6 (six) hours as needed for wheezing or shortness of breath.    [provider]  ASMANEX, 30 METERED DOSES, 220 MCG/INH inhaler INHALE 1 INHALATION INTO THE LUNGS ONCE DAILY. 09/11/17   [provider]  atorvastatin (LIPITOR) 10 MG tablet Take 10 mg by mouth daily. 09/19/17   [provider]  benzonatate (TESSALON) 100 MG capsule Take 2 capsules (200 mg total) by mouth every 8 (eight) hours. 02/20/20   Margarette Canada, NP  cyclobenzaprine (FLEXERIL) 10 MG tablet Take 1 tablet (10 mg total) by mouth 3 (three) times daily as needed for muscle spasms. 10/12/17   Norval Gable, MD  doxycycline (VIBRAMYCIN) 100 MG capsule Take 1 capsule (100 mg  total) by mouth 2 (two) times daily. 02/20/20   Margarette Canada, NP  Fluocinonide 0.1 % CREA Apply topically. 11/23/15   [provider]  Fluticasone-Salmeterol (ADVAIR DISKUS) 250-50 MCG/DOSE AEPB Inhale 1 puff into the lungs 2 (two) times daily. 06/18/15   Demetrios Loll, MD  ibuprofen (ADVIL,MOTRIN) 200 MG tablet Take 400 mg by mouth every 6 (six) hours as needed for headache or mild pain. Reported on 07/07/2015    [provider]  lisinopril (PRINIVIL,ZESTRIL) 5 MG tablet Take 5 mg by mouth daily.    [provider]  losartan (COZAAR) 100 MG tablet Take 100 mg by mouth daily. 02/08/20   [provider]  losartan (COZAAR) 25 MG tablet Take 25 mg by mouth daily. 09/11/17   [provider]  metFORMIN (GLUCOPHAGE-XR) 500 MG 24 hr tablet Take by mouth. 09/19/17   [provider]  methylPREDNISolone (MEDROL DOSEPAK) 4 MG TBPK tablet Take per package instructions 02/16/18   Crecencio Mc P, PA-C  montelukast (SINGULAIR) 10 MG tablet Take by mouth. 09/17/17   [provider]  omeprazole (PRILOSEC) 40 MG capsule Take 40 mg by mouth daily. 02/08/20   [provider]  promethazine-dextromethorphan (PROMETHAZINE-DM) 6.25-15 MG/5ML syrup Take 5 mLs by mouth 4 (four) times daily as needed. Patient taking differently: Take 5 mLs by mouth 4 (four) times daily as needed. 02/20/20   Margarette Canada, NP  triamcinolone ointment (KENALOG) 0.1 % Apply topically. 02/16/20   [provider]      VITAL SIGNS:  Blood pressure (!) 146/67, pulse 92, temperature 98.3 F (36.8 C), temperature source Oral, resp. rate (!) 29, height 5\' 1"  (1.549 m), weight 99.8 kg, SpO2 94 %.  PHYSICAL EXAMINATION:  Physical Exam  GENERAL:  60 y.o.-year-old Caucasian female patient lying in the bed with mild respiratory distress with conversational dyspnea getting continuous nebulized albuterol. EYES: Pupils equal, round, reactive to light and accommodation. No scleral  icterus. Extraocular muscles intact.  HEENT: Head atraumatic, normocephalic. Oropharynx and nasopharynx clear.  NECK:  Supple, no jugular venous distention. No thyroid enlargement, no tenderness.  LUNGS: Diffuse expiratory wheezes with tight expiratory airflow in the heart vesicular breathing. CARDIOVASCULAR: Regular rate and rhythm, S1, S2 normal. No murmurs, rubs, or gallops.  ABDOMEN: Soft, nondistended, nontender. Bowel sounds present. No organomegaly or mass.  EXTREMITIES: No pedal edema, cyanosis, or clubbing.  NEUROLOGIC: Cranial nerves II through XII are intact. Muscle strength 5/5 in all extremities. Sensation intact. Gait not checked.  PSYCHIATRIC: The patient is alert and oriented x 3.  Normal affect and good eye contact. SKIN: No obvious rash, lesion, or ulcer.   LABORATORY PANEL:   CBC Recent Labs  Lab 07/21/20 2135  WBC 9.8  HGB 16.0*  HCT 48.4*  PLT 179   ------------------------------------------------------------------------------------------------------------------  Chemistries  Recent Labs  Lab 07/21/20 2135  NA 139  K 3.7  CL  106  CO2 24  GLUCOSE 122*  BUN 16  CREATININE 0.61  CALCIUM 9.4  AST 18  ALT 26  ALKPHOS 70  BILITOT 0.8   ------------------------------------------------------------------------------------------------------------------  Cardiac Enzymes No results for input(s): TROPONINI in the last 168 hours. ------------------------------------------------------------------------------------------------------------------  RADIOLOGY:  DG Chest 1 View  Result Date: 07/21/2020 CLINICAL DATA:  Fall. EXAM: CHEST  1 VIEW COMPARISON:  February 20, 2020. FINDINGS: The heart size and mediastinal contours are within normal limits. Both lungs are clear. The visualized skeletal structures are unremarkable. IMPRESSION: No active disease. Electronically Signed   By: Marijo Conception M.D.   On: 07/21/2020 20:38      IMPRESSION AND PLAN:  Active  Problems:   COPD exacerbation ([place])  1.  COPD and asthma acute exacerbation likely secondary to acute bronchitis. - The patient will be admitted to a stepdown unit bed. - We will continue nebulized bronchodilator therapy with DuoNebs 4 times daily and every 4 hours as needed. - We will continue mucolytic therapy. - We will add IV antibiotic therapy given severity of COPD exacerbation with IV Rocephin. - We will follow sputum culture.  2.  Acute hypoxic respiratory failure secondary to #1. - O2 protocol be followed.  Pulse oximetry is currently adequate with high flow nasal cannula.. - Management otherwise as above.  3.  Essential hypertension. - We will continue her antihypertensives.  4.  Type 2 diabetes mellitus. - The patient will be placed on supplemental coverage with NovoLog. - We will hold off her metformin.  5.  Dyslipidemia. - We will continue statin therapy.  6.  GERD. - Continue PPI therapy.  DVT prophylaxis: Lovenox. Code Status: full code. Family Communication:  The plan of care was discussed in details with the patient (and family). I answered all questions. The patient agreed to proceed with the above mentioned plan. Further management will depend upon hospital course. Disposition Plan: Back to previous home environment Consults called: none. All the records are reviewed and case discussed with ED provider.  Status is: Inpatient  Remains inpatient appropriate because:Ongoing diagnostic testing needed not appropriate for outpatient work up, Unsafe d/c plan, IV treatments appropriate due to intensity of illness or inability to take PO and Inpatient level of care appropriate due to severity of illness   Dispo: The patient is from: Home              Anticipated d/c is to: Home              Patient currently is not medically stable to d/c.   Difficult to place patient No   TOTAL TIME TAKING CARE OF THIS PATIENT: 55 minutes.    Christel Mormon M.D on 07/22/2020 at  12:25 AM  [facility]   From 7 PM-7 AM, contact night-coverage [email or web address]  CC: Primary care physician; Gauger, Victoriano Lain, NP

## 2020-07-22 NOTE — ED Notes (Signed)
Pharmacy messaged to retime medications that are not verified at this time

## 2020-07-22 NOTE — Plan of Care (Signed)
Neuro: stable at baseline Resp: stable on HF [place], sputum culture and Respiratory PCR sent and pending CV: afebrile, some HTN-Labetalol given with improvement, NSR to ST [facility]IGU: tolerating PO well, low appetite, purewick in place, no BM or emesis Skin: clean dry and intact Social: Husband came to visit this afternoon, all questions and concerns addressed with patient.   Events: Admitted to the ICU from the ED this AM.   Problem: Education: Goal: Knowledge of General Education information will improve Description: Including pain rating scale, medication(s)/side effects and non-pharmacologic comfort measures Outcome: Not Progressing   Problem: Health Behavior/Discharge Planning: Goal: Ability to manage health-related needs will improve Outcome: Not Progressing   Problem: Clinical Measurements: Goal: Ability to maintain clinical measurements within normal limits will improve Outcome: Not Progressing Goal: Will remain free from infection Outcome: Not Progressing Goal: Diagnostic test results will improve Outcome: Not Progressing Goal: Respiratory complications will improve Outcome: Not Progressing Goal: Cardiovascular complication will be avoided Outcome: Not Progressing   Problem: Activity: Goal: Risk for activity intolerance will decrease Outcome: Not Progressing   Problem: Nutrition: Goal: Adequate nutrition will be maintained Outcome: Not Progressing   Problem: Coping: Goal: Level of anxiety will decrease Outcome: Not Progressing   Problem: Elimination: Goal: Will not experience complications related to bowel motility Outcome: Not Progressing Goal: Will not experience complications related to urinary retention Outcome: Not Progressing   Problem: Pain Managment: Goal: General experience of comfort will improve Outcome: Not Progressing   Problem: Safety: Goal: Ability to remain free from injury will improve Outcome: Not Progressing   Problem: Skin Integrity: Goal:  Risk for impaired skin integrity will decrease Outcome: Not Progressing

## 2020-07-22 NOTE — Progress Notes (Signed)
Order Requisition for Advanced Directive. Education given for the document, packet left with patient to be completed. Informed her once done, contact the chaplain to facilitate the notarization.

## 2020-07-22 NOTE — ED Provider Notes (Signed)
[facility]    CSN: [id number] Arrival date & time: 07/21/20  1854      History   Chief Complaint Chief Complaint  Patient presents with  . Cough  . Shortness of Breath    HPI Heather Nguyen is a 60 y.o. female.   Patient is a 60 y.o. female with medical history significant for asthma/COPD, type 2 diabetes mellitus and hypertension, who presented to the [facility] urgent care with acute onset of worsening dyspnea with associated cough productive of clear sputum and wheezing for the last 3 to 4 days.  She denies any fever or chills.  No chest pain or palpitations.  No nausea or vomiting or abdominal pain.  She denies any hemoptysis or other bleeding diathesis.  No dysuria, degree of hematuria or flank pain.  She is not oxygen dependent at home.  She gets her primary care needs met by [facility] clinic in [place].  Despite being sick for the past 3 to 4 days she has not sought any care.  She acutely decompensated and was short of breath and was brought by her husband to the urgent care.  I reviewed her chart in detail.  She does not have a regular pulmonologist.  When she presented to the urgent care blood pressure was [id number] with respiratory to 26 and pulse oximetry was 80% on room air 85% on 2 L [place], 90% on 4 L [place], and 92% 6 L [place] oxygen.  Given her COPD, and previous office visit showed a maximum oxygen saturation of 95%, and nonrebreather was not initiated.  Patient reports having COPD exacerbations in the past as well as bronchitis and she says it feels the same.  That said when I question her she says she has never been at 80%.     Past Medical History:  Diagnosis Date  . Asthma   . Diabetes mellitus without complication ([place])   . Hypertension     Patient Active Problem List   Diagnosis Date Noted  . Acute respiratory failure with hypoxia ([place])   . COPD exacerbation ([place]) 07/21/2020  . Asthma exacerbation 06/16/2015    Past Surgical History:  Procedure Laterality  Date  . 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 Authorizing Provider  albuterol (PROVENTIL HFA;VENTOLIN HFA) 108 (90 Base) MCG/ACT inhaler Inhale 2 puffs into the lungs every 6 (six) hours as needed for wheezing or shortness of breath.   Yes [provider]  ASMANEX, 30 METERED DOSES, 220 MCG/INH inhaler INHALE 1 INHALATION INTO THE LUNGS ONCE DAILY. Patient not taking: Reported on 07/22/2020 09/11/17  Yes [provider]  atorvastatin (LIPITOR) 40 MG tablet Take 40 mg by mouth daily. 09/19/17  Yes [provider]  cyclobenzaprine (FLEXERIL) 10 MG tablet Take 1 tablet (10 mg total) by mouth 3 (three) times daily as needed for muscle spasms. Patient not taking: Reported on 07/22/2020 10/12/17  Yes Norval Gable, MD  Fluocinonide 0.1 % CREA Apply topically. Patient not taking: Reported on 07/22/2020 11/23/15  Yes [provider]  Fluticasone-Salmeterol (ADVAIR DISKUS) 250-50 MCG/DOSE AEPB Inhale 1 puff into the lungs 2 (two) times daily. 06/18/15  Yes Demetrios Loll, MD  ibuprofen (ADVIL,MOTRIN) 200 MG tablet Take 400 mg by mouth every 6 (six) hours as needed for headache or mild pain. Reported on 07/07/2015   Yes [provider]  lisinopril (PRINIVIL,ZESTRIL) 5 MG tablet Take 5 mg by mouth daily. Patient not taking: Reported on 07/22/2020   Yes [provider]  losartan (COZAAR) 100 MG tablet Take 100 mg by mouth daily. 02/08/20  Yes [provider]  losartan (COZAAR) 25 MG tablet Take 25 mg by mouth daily. Patient not taking: Reported on 07/22/2020 09/11/17  Yes [provider]  metFORMIN (GLUCOPHAGE-XR) 500 MG 24 hr tablet Take 500 mg by mouth 2  (two) times daily. 09/19/17  Yes [provider]  montelukast (SINGULAIR) 10 MG tablet Take 10 mg by mouth at bedtime. 09/17/17  Yes [provider]  omeprazole (PRILOSEC) 40 MG capsule Take 40 mg by mouth daily. 02/08/20  Yes [provider]  promethazine-dextromethorphan (PROMETHAZINE-DM) 6.25-15 MG/5ML syrup Take 5 mLs by mouth 4 (four) times daily as needed. Patient not taking: Reported on 07/22/2020 02/20/20  Yes Margarette Canada, NP  triamcinolone ointment (KENALOG) 0.1 % Apply topically. 02/16/20  Yes [provider]  benzonatate (TESSALON) 100 MG capsule Take 2 capsules (200 mg total) by mouth every 8 (eight) hours. Patient not taking: Reported on 07/22/2020 02/20/20   Margarette Canada, NP  doxycycline (VIBRAMYCIN) 100 MG capsule Take 1 capsule (100 mg total) by mouth 2 (two) times daily. Patient not taking: Reported on 07/22/2020 02/20/20   Margarette Canada, NP  loratadine (CLARITIN) 10 MG tablet Take 10 mg by mouth daily. 06/02/20   [provider]  methylPREDNISolone (MEDROL DOSEPAK) 4 MG TBPK tablet Take per package instructions Patient not taking: Reported on 07/22/2020 02/16/18   Lorin Picket, PA-C  nicotine (NICODERM CQ - DOSED IN MG/24 HR) 7 mg/24hr patch Place 1 patch onto the skin daily. 02/05/19   [provider]    Family History Family History  Problem Relation Age of Onset  . CAD Father   . Asthma Mother   . Breast cancer Cousin 22       pat cousin    Social History Social History   Tobacco Use  . Smoking status: Former Smoker    Packs/day: 1.00    Years: 35.00    Pack years: 35.00    Types: Cigarettes  . Smokeless tobacco: Never Used  Substance Use Topics  . Alcohol use: No    Alcohol/week: 0.0 standard drinks  . Drug use: No     Allergies   Codeine   Review of Systems Review of Systems  Constitutional: Positive for activity change and fatigue. Negative for appetite change, chills, diaphoresis and fever.   HENT: Positive for congestion. Negative for ear pain, postnasal drip, rhinorrhea, sinus pressure, sinus pain, sneezing and sore throat.   Eyes: Negative for pain.  Respiratory: Positive for apnea, cough, chest tightness, shortness of breath and wheezing.   Cardiovascular: Negative for chest pain and palpitations.  Gastrointestinal: Negative for abdominal pain, diarrhea, nausea and vomiting.  Genitourinary: Negative for dysuria.  Musculoskeletal: Negative for back pain, myalgias and neck pain.  Skin: Negative for color change, pallor, rash and wound.  Neurological: Negative for dizziness, seizures, syncope, light-headedness, numbness and headaches.  All other systems reviewed and are negative.    Physical Exam Triage Vital Signs ED Triage Vitals  Enc Vitals Group     BP 07/21/20 1913 (!) 164/88     Pulse Rate 07/21/20 1913 96     Resp 07/21/20 1913 (!) 24     Temp 07/21/20 1913 98.2 F (36.8 C)     Temp Source 07/21/20 1913 Oral     SpO2 07/21/20 1913 (!) 80 %     Weight 07/21/20 1914 220 lb 0.3 oz (99.8 kg)     Height 07/21/20 1914 5' 1"  (1.549 m)     Head Circumference --      Peak Flow --      Pain Score 07/21/20 1913 0     Pain Loc --      Pain Edu? --      Excl. in [place]? --    No data found.  Updated Vital Signs BP (!) 164/88 (BP Location: Right Arm)   Pulse 96   Temp 98.2 F (36.8 C) (Oral)   Resp (!) 24   Ht 5' 1"  (1.549 m)   Wt 99.8 kg   SpO2 91%   BMI 41.57 kg/m   Visual Acuity Right Eye Distance:   Left Eye Distance:   Bilateral Distance:    Right Eye Near:   Left Eye Near:    Bilateral Near:     Physical Exam Vitals and nursing note reviewed.  Constitutional:      General: She is in acute distress.     Appearance: Normal appearance.  HENT:     Head: Normocephalic and atraumatic.     Nose: Nose normal.     Mouth/Throat:     Mouth: Mucous membranes are moist.  Eyes:     Conjunctiva/sclera: Conjunctivae normal.     Pupils: Pupils are equal,  round, and reactive to light.  Cardiovascular:     Rate and Rhythm: Normal rate and regular rhythm.  No extrasystoles are present.    Pulses: Normal pulses. No decreased pulses.     Heart sounds: Normal heart sounds. No murmur heard. No friction rub. No gallop.   Pulmonary:     Effort: Tachypnea, accessory muscle usage and respiratory distress present.     Breath sounds: No stridor. Examination of the right-upper field reveals wheezing and rhonchi. Examination of the left-upper field reveals wheezing and rhonchi. Examination of the right-middle field reveals wheezing and rhonchi. Examination of the left-middle field reveals wheezing and rhonchi. Examination of the right-lower field reveals wheezing and rhonchi. Examination of the left-lower field reveals wheezing and rhonchi. Wheezing and rhonchi present. No rales.  Musculoskeletal:     Cervical back: Normal range of motion and neck supple.  Skin:    General: Skin is warm and dry.     Capillary Refill: Capillary refill takes less than 2 seconds.  Neurological:     General: No focal deficit present.     Mental Status: She is alert and oriented to person, place, and time.      UC Treatments / Results  Labs (all labs ordered are listed, but only abnormal results are displayed) Labs Reviewed - No data to display  EKG   Radiology   Procedures Procedures (including critical care time)  Medications Ordered in UC Medications - No data to display  Initial Impression / Assessment and Plan / UC Course  I have reviewed the triage vital signs and the nursing notes.  Pertinent labs & imaging results  that were available during my care of the patient were reviewed by me and considered in my medical decision making (see chart for details).  Clinical impression: 60 year old female with a history of COPD, asthma, and prior episodes of bronchitis who presents with acute shortness of breath after a illness for the past 4 to 5 days.  She is in  acute respiratory distress, tachypneic to 26, and hypoxic to 80% on room air on presentation.  Treatment plan: 1.  The findings and treatment plan were discussed in detail with the patient.  Patient was in agreement. 2.  I discussed with the patient my concern that she was critically ill.  I wanted to mobilize EMS but the patient declined to go by EMS to the hospital.  Her husband had dropped her off.  A few minutes later he arrived back at the clinic and I attempted to discuss the critical nature of her presentation with both of them.  She once again wanted her husband to take her to the hospital.  She elected to go to [facility].  She refused to go by EMS.  I indicated that if she did not go by EMS that she would need to sign out [place] which she did so. 3.  It is my hope that she will go directly to the ER and get the appropriate care that she needs.    Final Clinical Impressions(s) / UC Diagnoses   Final diagnoses:  Shortness of breath  Hypoxia  Cough  Wheezing  COPD exacerbation [facility])     Discharge Instructions     My recommendation is that you go to the emergency room via EMS for shortness of breath, oxygen saturation of 80%, and impending respiratory failure.  He refused and wanted to go by your personal vehicle.  He signed out [place].  Please go directly to the emergency room.    ED Prescriptions    None     PDMP not reviewed this encounter.   Verda Cumins, MD 07/22/20 [phone]

## 2020-07-22 NOTE — ED Notes (Signed)
RT called to assist in moving pt to [place] room 33

## 2020-07-22 NOTE — Progress Notes (Signed)
PHARMACIST - PHYSICIAN COMMUNICATION  CONCERNING:  Enoxaparin (Lovenox) for DVT Prophylaxis    RECOMMENDATION: Patient was prescribed enoxaprin 40mg  q24 hours for VTE prophylaxis.   Filed Weights   07/21/20 2013  Weight: 99.8 kg (220 lb)    Body mass index is 41.57 kg/m.  Estimated Creatinine Clearance: 81 mL/min (by C-G formula based on SCr of 0.61 mg/dL).   Based on [place] patient is candidate for enoxaparin 0.5mg /kg TBW SQ every 24 hours based on BMI being >30.  Patient is candidate for enoxaparin 30mg  every 24 hours based on CrCl <24ml/min or Weight <45kg  DESCRIPTION: Pharmacy has adjusted enoxaparin dose per [facility] policy.  Patient is now receiving enoxaparin 0.5 mg/kg every 24 hours   Renda Rolls, PharmD, [facility] 07/22/2020 1:12 AM

## 2020-07-22 NOTE — Consult Note (Signed)
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  Shares her husband has been sick with flu like symptoms 1 wk prior to her having onset of cough and respiratory decline. She was found to be tachypneic in ER with hypoxemic respiratory failure requiring 10L continuous O2 therapy to reach normoxia. I evaluated her in medical step down unit while she was on HFNC and with simple vocalizing during interview she had desaturation.  PCCM consult for further evaluation and management.    PAST MEDICAL HISTORY   Past Medical History:  Diagnosis Date  . Asthma   . Diabetes mellitus without complication ([place])   . Hypertension      SURGICAL HISTORY   Past Surgical History:  Procedure Laterality Date  . 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 CAD Father   . Asthma Mother   . Breast cancer Cousin 74       pat cousin     SOCIAL HISTORY   Social History   Tobacco Use  . Smoking status: Former Smoker    Packs/day: 1.00    Years: 35.00    Pack years: 35.00    Types: Cigarettes  . Smokeless tobacco: Never Used  Substance Use Topics  . Alcohol use: No    Alcohol/week: 0.0 standard drinks  . Drug use: No     MEDICATIONS    Home Medication:    Current Medication:  Current Facility-Administered Medications:  .  acetaminophen (TYLENOL) tablet 650 mg, 650 mg, Oral, Q6H PRN **OR** acetaminophen (TYLENOL) suppository 650 mg, 650 mg, Rectal, Q6H PRN, Mansy, Jan A, MD .  atorvastatin (LIPITOR) tablet 10 mg, 10 mg,  Oral, Daily, Mansy, Jan A, MD, 10 mg at 07/22/20 0941 .  azithromycin (ZITHROMAX) tablet 500 mg, 500 mg, Oral, Daily **FOLLOWED BY** [START ON 07/23/2020] azithromycin (ZITHROMAX) tablet 250 mg, 250 mg, Oral, Daily, Fritzi Mandes, MD .  Chlorhexidine Gluconate Cloth 2 % PADS 6 each, 6 each, Topical, Daily, Mansy, Arvella Merles, MD, 6 each at 07/22/20 0930 .  dextromethorphan-guaiFENesin (MUCINEX DM) 30-600 MG per 12 hr tablet 1 tablet, 1 tablet, Oral, BID PRN, Mansy, Jan A, MD .  enoxaparin (LOVENOX) injection 50 mg, 0.5 mg/kg, Subcutaneous, Q24H, Mansy, Jan A, MD, 50 mg at 07/22/20 1226 .  guaiFENesin (MUCINEX) 12 hr tablet 600 mg, 600 mg, Oral, BID, Mansy, Jan A, MD, 600 mg at 07/22/20 0941 .  insulin aspart (novoLOG) injection 0-15 Units, 0-15 Units, Subcutaneous, TID AC & HS, Mansy, Arvella Merles, MD, 3 Units at 07/22/20 1225 .  [DISCONTINUED] ipratropium-albuterol (DUONEB) 0.5-2.5 (3) MG/3ML nebulizer solution 3 mL, 3 mL, Nebulization, Q4H PRN **OR** ipratropium-albuterol (DUONEB) 0.5-2.5 (3) MG/3ML nebulizer solution 3 mL, 3 mL, Nebulization, Q4H PRN, Belue, Alver Sorrow, RPH, 3 mL at 07/22/20 1435 .  labetalol (NORMODYNE) injection 20 mg, 20 mg, Intravenous, Q3H PRN, Mansy, Jan A,  MD .  losartan (COZAAR) tablet 100 mg, 100 mg, Oral, Daily, Mansy, Jan A, MD, 100 mg at 07/22/20 0941 .  magnesium hydroxide (MILK OF MAGNESIA) suspension 30 mL, 30 mL, Oral, Daily PRN, Mansy, Jan A, MD .  methylPREDNISolone sodium succinate (SOLU-MEDROL) 40 mg/mL injection 40 mg, 40 mg, Intravenous, Q6H, 40 mg at 07/22/20 0942 **FOLLOWED BY** [START ON 07/23/2020] predniSONE (DELTASONE) tablet 40 mg, 40 mg, Oral, Q breakfast, Mansy, Jan A, MD .  mometasone-formoterol [facility]) 200-5 MCG/ACT inhaler 2 puff, 2 puff, Inhalation, BID, Fritzi Mandes, MD, 2 puff at 07/22/20 1226 .  montelukast (SINGULAIR) tablet 10 mg, 10 mg, Oral, QHS, Mansy, Jan A, MD .  ondansetron (ZOFRAN) tablet 4 mg, 4 mg, Oral, Q6H PRN **OR** ondansetron (ZOFRAN) injection 4  mg, 4 mg, Intravenous, Q6H PRN, Mansy, Jan A, MD .  pantoprazole (PROTONIX) EC tablet 40 mg, 40 mg, Oral, Daily, Mansy, Jan A, MD, 40 mg at 07/22/20 0941 .  traZODone (DESYREL) tablet 25 mg, 25 mg, Oral, QHS PRN, Mansy, Jan A, MD    ALLERGIES   Codeine     REVIEW OF SYSTEMS    Review of Systems:  Gen:  Denies  fever, sweats, chills weigh loss  HEENT: Denies blurred vision, double vision, ear pain, eye pain, hearing loss, nose bleeds, sore throat Cardiac:  No dizziness, chest pain or heaviness, chest tightness,edema Resp:   Denies cough or sputum porduction, shortness of breath,wheezing, hemoptysis,  Gi: Denies swallowing difficulty, stomach pain, nausea or vomiting, diarrhea, constipation, bowel incontinence Gu:  Denies bladder incontinence, burning urine Ext:   Denies Joint pain, stiffness or swelling Skin: Denies  skin rash, easy bruising or bleeding or hives Endoc:  Denies polyuria, polydipsia , polyphagia or weight change Psych:   Denies depression, insomnia or hallucinations   Other:  All other systems negative   VS: BP (!) 160/92 (BP Location: Left Arm)   Pulse (!) 109   Temp 98.1 F (36.7 C) (Oral)   Resp (!) 26   Ht 5' 1"  (1.549 m)   Wt 104.3 kg   SpO2 93%   BMI 43.45 kg/m      PHYSICAL EXAM    GENERAL:NAD, no fevers, chills, no weakness no fatigue HEAD: Normocephalic, atraumatic.  EYES: Pupils equal, round, reactive to light. Extraocular muscles intact. No scleral icterus.  MOUTH: Moist mucosal membrane. Dentition intact. No abscess noted.  EAR, NOSE, THROAT: Clear without exudates. No external lesions.  NECK: Supple. No thyromegaly. No nodules. No JVD.  PULMONARY: Diffuse coarse rhonchi right sided +wheezes CARDIOVASCULAR: S1 and S2. Regular rate and rhythm. No murmurs, rubs, or gallops. No edema. Pedal pulses 2+ bilaterally.  GASTROINTESTINAL: Soft, nontender, nondistended. No masses. Positive bowel sounds. No hepatosplenomegaly.  MUSCULOSKELETAL:  No swelling, clubbing, or edema. Range of motion full in all extremities.  NEUROLOGIC: Cranial nerves II through XII are intact. No gross focal neurological deficits. Sensation intact. Reflexes intact.  SKIN: No ulceration, lesions, rashes, or cyanosis. Skin warm and dry. Turgor intact.  PSYCHIATRIC: Mood, affect within normal limits. The patient is awake, alert and oriented x 3. Insight, judgment intact.       IMAGING    DG Chest 1 View  Result Date: 07/21/2020 CLINICAL DATA:  Fall. EXAM: CHEST  1 VIEW COMPARISON:  February 20, 2020. FINDINGS: The heart size and mediastinal contours are within normal limits. Both lungs are clear. The visualized skeletal structures are unremarkable. IMPRESSION: No active disease. Electronically Signed   By: Marijo Conception  M.D.   On: 07/21/2020 20:38      ASSESSMENT/PLAN   Acute hypoxemic respiratory failure - present on admission - suspect viral LRTI induced sever Acute exacerbation of COPD - COVID19 negative   - supplemental O2 during my evaluation HFNC - will perform infectious workup for pneumonia -Respiratory viral panel -serum fungitell -legionella ab -strep pneumoniae urine -sputum resp cultures -reviewed pertinent imaging with patient today - ESR -PT/OT for d/c planning  -please encourage patient to use incentive spirometer few times each hour while hospitalized.   -interval comparison of CXR shows similar interstitial infiltrates during previous AECOPD episode, additionally there is absence of cardiomegaly with clear costophrenic angle which suggests unlikely CHF related interstitial prominence.  There is absence of pneumothorax, consolidation, plerual effusion.      Thank you for allowing me to participate in the care of this patient.  Total face to face encounter time for this patient visit was 72mn. >50% of the time was  spent in counseling and coordination of care.   Patient/Family are satisfied with care plan and all questions  have been answered.  This document was prepared using Dragon voice recognition software and may include unintentional dictation errors.     FOttie Glazier M.D.  Division of [place]

## 2020-07-22 NOTE — ED Notes (Signed)
Lab called at this time to help collect 2nd set of cultures

## 2020-07-22 NOTE — Progress Notes (Signed)
Patient ID: Heather Nguyen, female   DOB: Aug 20, 1960, 60 y.o.   MRN: [id number]  Patient admitted with significant shortness of breath dry hacking cough and increasing oxygen requirement. She has history of COPD X smoker. Does not use oxygen at home. Currently on high flow nasal cannula about 10 to 14 L. Try to wean her down sats dropped easily. Chest x-ray no acute active disease. Continue IV steroids, bronchodilator, incentive spirometer and flutter valve. Pulmonary consultation with Dr. Loni Muse. Will await further recommendations for possible CT chest imaging if needed.  Likely has underlying sleep apnea which is not being treated contributing her to current symptoms.  Discuss with family in the room. Patient is full code.

## 2020-07-23 DIAGNOSIS — J206 Acute bronchitis due to rhinovirus: Secondary | ICD-10-CM

## 2020-07-23 DIAGNOSIS — I10 Essential (primary) hypertension: Secondary | ICD-10-CM

## 2020-07-23 LAB — GLUCOSE, CAPILLARY
Glucose-Capillary: 174 mg/dL — ABNORMAL HIGH (ref 70–99)
Glucose-Capillary: 156 mg/dL — ABNORMAL HIGH (ref 70–99)
Glucose-Capillary: 194 mg/dL — ABNORMAL HIGH (ref 70–99)
Glucose-Capillary: 107 mg/dL — ABNORMAL HIGH (ref 70–99)

## 2020-07-23 LAB — EXPECTORATED SPUTUM ASSESSMENT W GRAM STAIN, RFLX TO RESP C

## 2020-07-23 NOTE — Progress Notes (Signed)
[facility] [place] NAME: Heather Nguyen    MR#:  [id number]  DATE OF BIRTH:  07/02/1960  SUBJECTIVE:  feel a bit better. Dry hacking cough. Wants to go home. On 10 L nasal cannula oxygen no fever. Able to complete sentence without shortness of breath.  REVIEW OF SYSTEMS:   Review of Systems  Constitutional: Positive for malaise/fatigue. Negative for chills, fever and weight loss.  HENT: Negative for ear discharge, ear pain and nosebleeds.   Eyes: Negative for blurred vision, pain and discharge.  Respiratory: Positive for cough and shortness of breath. Negative for sputum production, wheezing and stridor.   Cardiovascular: Negative for chest pain, palpitations, orthopnea and PND.  Gastrointestinal: Negative for abdominal pain, diarrhea, nausea and vomiting.  Genitourinary: Negative for frequency and urgency.  Musculoskeletal: Negative for back pain and joint pain.  Neurological: Positive for weakness. Negative for sensory change, speech change and focal weakness.  Psychiatric/Behavioral: Negative for depression and hallucinations. The patient is not nervous/anxious.    Tolerating Diet: Tolerating PT:   DRUG ALLERGIES:   Allergies  Allergen Reactions  . Codeine Anaphylaxis and Swelling    VITALS:  Blood pressure (!) 143/90, pulse 85, temperature 98.7 F (37.1 C), temperature source Oral, resp. rate (!) 24, height 5\' 1"  (1.549 m), weight 104.3 kg, SpO2 91 %.  PHYSICAL EXAMINATION:   Physical Exam  GENERAL:  60 y.o.-year-old patient lying in the bed with no acute distress. Obese LUNGS: coarse breath sounds bilaterally, no wheezing, rales, rhonchi. No use of accessory muscles of respiration.  CARDIOVASCULAR: S1, S2 normal. No murmurs, rubs, or gallops.  ABDOMEN: Soft, nontender, nondistended. Bowel sounds present. No organomegaly or mass.  EXTREMITIES: No cyanosis, clubbing or edema b/l.    NEUROLOGIC: Cranial nerves II through  XII are intact. No focal Motor or sensory deficits b/l.   PSYCHIATRIC:  patient is alert and oriented x 3.  SKIN: No obvious rash, lesion, or ulcer.   LABORATORY PANEL:  CBC Recent Labs  Lab 07/22/20 0225  WBC 8.3  HGB 15.5*  HCT 47.7*  PLT 178    Chemistries  Recent Labs  Lab 07/21/20 2135 07/22/20 0225  NA 139 136  K 3.7 3.4*  CL 106 104  CO2 24 21*  GLUCOSE 122* 218*  BUN 16 19  CREATININE 0.61 0.74  CALCIUM 9.4 9.1  AST 18  --   ALT 26  --   ALKPHOS 70  --   BILITOT 0.8  --    Cardiac Enzymes No results for input(s): TROPONINI in the last 168 hours. RADIOLOGY:  DG Chest 1 View  Result Date: 07/21/2020 CLINICAL DATA:  Fall. EXAM: CHEST  1 VIEW COMPARISON:  February 20, 2020. FINDINGS: The heart size and mediastinal contours are within normal limits. Both lungs are clear. The visualized skeletal structures are unremarkable. IMPRESSION: No active disease. Electronically Signed   By: Marijo Conception M.D.   On: 07/21/2020 20:38   ASSESSMENT AND PLAN:  Heather Nguyen is a 60 y.o. Caucasian female with medical history significant for asthma/COPD, type 2 diabetes mellitus and hypertension, who presented to the emergency room with acute onset of worsening dyspnea with associated cough productive of clear sputum and wheezing for the last 3 to 4 days.  She denies any fever or chills  Acute hypoxic respiratory failure secondary to COPD/asthma exacerbation due to Rhinovirus (per respiratory panel) - patient was on high flow nasal cannula oxygen currently being down  to 10 L nasal cannula. Continue to wean to keep sats greater than 87%. -- Bronchodilators as needed -- incentive spirometer, flutter valve -- supportive care -- empiric Z pack -- PRN cough medicine -- assess for home oxygen need  Hypertension -- continue losartan  Type II diabetes with hyperglycemia in the setting of steroids -- continue sliding scale insulin -- will resume metformin at  discharge  Hyperlipidemia -- continue statins  Obesity with suspected sleep apnea -- patient recommended get formal sleep study to evaluate for CPAP as outpatient with primary care  Jerrye Bushy continue PPI    Procedures: Family communication : husband in the room yesterday Consults : pulmonary CODE STATUS: full DVT Prophylaxis : Lovenox Level of care: Med-Surg Status is: Inpatient  Remains inpatient appropriate because:Inpatient level of care appropriate due to severity of illness   Dispo: The patient is from: Home              Anticipated d/c is to: Home              Patient currently is not medically stable to d/c.   Difficult to place patient Yes        TOTAL TIME TAKING CARE OF THIS PATIENT: 25 minutes.  >50% time spent on counselling and coordination of care  Note: This dictation was prepared with Dragon dictation along with smaller phrase technology. Any transcriptional errors that result from this process are unintentional.  Fritzi Mandes M.D    [facility]   CC: Primary care physician; Sallee Lange, NPPatient ID: Heather Nguyen, female   DOB: [birth year], 60 y.o.   MRN: [id number]

## 2020-07-24 LAB — CULTURE, RESPIRATORY W GRAM STAIN: Culture: NORMAL

## 2020-07-24 LAB — GLUCOSE, CAPILLARY: Glucose-Capillary: 115 mg/dL — ABNORMAL HIGH (ref 70–99)

## 2020-07-24 MED ORDER — AZITHROMYCIN 250 MG PO TABS: 250.0000 mg | ORAL_TABLET | Freq: Every day | ORAL | 0 refills | Status: AC

## 2020-07-24 MED ORDER — DM-GUAIFENESIN ER 30-600 MG PO TB12: 1.0000 | ORAL_TABLET | Freq: Two times a day (BID) | ORAL | 0 refills | Status: DC | PRN

## 2020-07-24 MED ORDER — PREDNISONE 20 MG PO TABS: 40.0000 mg | ORAL_TABLET | Freq: Every day | ORAL | 0 refills | Status: AC

## 2020-07-24 NOTE — Progress Notes (Signed)
Sat 85% on room air on exertion.

## 2020-07-24 NOTE — TOC Transition Note (Signed)
Transition of Care [facility]) - CM/SW Discharge Note   Patient Details  Name: Heather Nguyen MRN: [id number] Date of Birth: 12-Mar-1960  Transition of Care [facility]) CM/SW Contact:  Harriet Masson, RN Phone Number:[phone] 07/24/2020, 1:32 PM   Clinical Narrative:    Pt discharging today and will need home O2. Spoke with pt and arranged [employer]) to deliver portable to pt's room and concentrator to the pt's residence in [place]. Pt has supportive system in the home and able to afford all her discharge medications. Pt has transportation to all her medical appointments. No DME needs.  TOC remains available to assist.         Patient Goals and CMS Choice        Discharge Placement                       Discharge Plan and Services                                     Social Determinants of Health (SDOH) Interventions     Readmission Risk Interventions No flowsheet data found.

## 2020-07-24 NOTE — Progress Notes (Signed)
SATURATION QUALIFICATIONS: (This note is used to comply with regulatory documentation for home oxygen)  Patient Saturations on Room Air at Rest = 87%  Patient Saturations on Room Air while Ambulating = 85%  Patient Saturations on 2 Liters of oxygen while Ambulating = 92%  Patient able to ambulate 150 feet on 2L [place] and maintain O2 sats 92%.  No increased SOB, WOB or dizziness noted.

## 2020-07-24 NOTE — Discharge Summary (Signed)
[place] [facility] [place] NAME: Heather Nguyen    MR#:  [id number]  DATE OF BIRTH:  1960-11-20  DATE OF ADMISSION:  07/21/2020 ADMITTING PHYSICIAN: Christel Mormon, MD  DATE OF DISCHARGE: 07/24/2020  PRIMARY CARE PHYSICIAN: Sallee Lange, NP    ADMISSION DIAGNOSIS:  SOB (shortness of breath) [R06.02] COPD exacerbation (HCC) [J44.1] Acute respiratory failure with hypoxia (HCC) [J96.01]  DISCHARGE DIAGNOSIS:  acute COPD exacerbation/acute hypoxic respiratory failure rhinovirus infection acute bronchitis suspected sleep apnea  SECONDARY DIAGNOSIS:   Past Medical History:  Diagnosis Date  . Asthma   . Diabetes mellitus without complication ([place])   . Hypertension     HOSPITAL COURSE:   Heather Laventure Medranois a 60 y.o.Caucasian femalewith medical history significant forasthma/COPD, type 2 diabetes mellitus and hypertension, who presented to the emergency room with acute onset of worsening dyspnea with associated cough productive of clear sputum and wheezing for the last 3 to 4 days. She denies any fever or chills  Acute hypoxic respiratory failure secondary to COPD/asthma exacerbation due to Rhinovirus (per respiratory panel) - patient was on high flow nasal cannula oxygen currently being down to 2 L nasal cannula. Continue to wean to keep sats greater than 87%. -- Bronchodilators as needed -- incentive spirometer, flutter valve -- supportive care -- empiric Z pack -- PRN cough medicine -- assess for home oxygen need--pt qualifies for home oxygen -po steroid x3 days  Hypertension -- continue losartan  Type II diabetes with hyperglycemia in the setting of steroids -- continue sliding scale insulin -- will resume metformin at discharge  Hyperlipidemia -- continue statins  Obesity with suspected sleep apnea -- patient recommended get formal sleep study to evaluate for CPAP as outpatient with primary care --recommend to  f/u Dr Caryl Bis continue PPI  overall improving. Will discharged to home with oxygen  Procedures: Family communication :none today Consults : pulmonary CODE STATUS: full DVT Prophylaxis : Lovenox Level of care: Med-Surg Status is: Inpatient   Dispo: The patient is from: Home  Anticipated d/c is to: Home today  Patient currently is medically stable to d/c.               CONSULTS OBTAINED:  Treatment Team:  Ottie Glazier, MD  DRUG ALLERGIES:   Allergies  Allergen Reactions  . Codeine Anaphylaxis and Swelling    DISCHARGE MEDICATIONS:   Allergies as of 07/24/2020      Reactions   Codeine Anaphylaxis, Swelling      Medication List    STOP taking these medications   Asmanex (30 Metered Doses) 220 MCG/INH inhaler Generic drug: mometasone   benzonatate 100 MG capsule Commonly known as: TESSALON   cyclobenzaprine 10 MG tablet Commonly known as: FLEXERIL   doxycycline 100 MG capsule Commonly known as: VIBRAMYCIN   Fluocinonide 0.1 % Crea   lisinopril 5 MG tablet Commonly known as: ZESTRIL   methylPREDNISolone 4 MG Tbpk tablet Commonly known as: MEDROL DOSEPAK   promethazine-dextromethorphan 6.25-15 MG/5ML syrup Commonly known as: PROMETHAZINE-DM     TAKE these medications   albuterol 108 (90 Base) MCG/ACT inhaler Commonly known as: VENTOLIN HFA Inhale 2 puffs into the lungs every 6 (six) hours as needed for wheezing or shortness of breath.   atorvastatin 40 MG tablet Commonly known as: LIPITOR Take 40 mg by mouth daily.   azithromycin 250 MG tablet Commonly known as: ZITHROMAX Take 1 tablet (250 mg total) by mouth daily for 2 days.  dextromethorphan-guaiFENesin 30-600 MG 12hr tablet Commonly known as: MUCINEX DM Take 1 tablet by mouth 2 (two) times daily as needed for cough.   Fluticasone-Salmeterol 250-50 MCG/DOSE Aepb Commonly known as: Advair Diskus Inhale 1 puff into the lungs 2 (two) times  daily.   ibuprofen 200 MG tablet Commonly known as: ADVIL Take 400 mg by mouth every 6 (six) hours as needed for headache or mild pain. Reported on 07/07/2015   loratadine 10 MG tablet Commonly known as: CLARITIN Take 10 mg by mouth daily.   losartan 100 MG tablet Commonly known as: COZAAR Take 100 mg by mouth daily. What changed: Another medication with the same name was removed. Continue taking this medication, and follow the directions you see here.   metFORMIN 500 MG 24 hr tablet Commonly known as: GLUCOPHAGE-XR Take 500 mg by mouth 2 (two) times daily.   montelukast 10 MG tablet Commonly known as: SINGULAIR Take 10 mg by mouth at bedtime.   nicotine 7 mg/24hr patch Commonly known as: NICODERM CQ - dosed in mg/24 hr Place 1 patch onto the skin daily.   omeprazole 40 MG capsule Commonly known as: PRILOSEC Take 40 mg by mouth daily.   predniSONE 20 MG tablet Commonly known as: DELTASONE Take 2 tablets (40 mg total) by mouth daily with breakfast for 3 days.   triamcinolone ointment 0.1 % Commonly known as: KENALOG Apply topically.            Durable Medical Equipment  (From admission, onward)         Start     Ordered   07/24/20 0800  DME Oxygen  Once       Question Answer Comment  Length of Need 6 Months   Mode or (Route) Nasal cannula   Liters per Minute 2   Frequency Continuous (stationary and portable oxygen unit needed)   Oxygen conserving device Yes   Oxygen delivery system Gas      07/24/20 0759          If you experience worsening of your admission symptoms, develop shortness of breath, life threatening emergency, suicidal or homicidal thoughts you must seek medical attention immediately by calling 911 or calling your MD immediately  if symptoms less severe.  You Must read complete instructions/literature along with all the possible adverse reactions/side effects for all the Medicines you take and that have been prescribed to you. Take any new  Medicines after you have completely understood and accept all the possible adverse reactions/side effects.   Please note  You were cared for by a hospitalist during your hospital stay. If you have any questions about your discharge medications or the care you received while you were in the hospital after you are discharged, you can call the unit and asked to speak with the hospitalist on call if the hospitalist that took care of you is not available. Once you are discharged, your primary care physician will handle any further medical issues. Please note that NO REFILLS for any discharge medications will be authorized once you are discharged, as it is imperative that you return to your primary care physician (or establish a relationship with a primary care physician if you do not have one) for your aftercare needs so that they can reassess your need for medications and monitor your lab values. Today   SUBJECTIVE   Overall feeling better. Down to 2 L nasal cannula oxygen.  VITAL SIGNS:  Blood pressure (!) 170/81, pulse 87, temperature 98 F (36.7  C), temperature source Oral, resp. rate 17, height 5\' 1"  (1.549 m), weight 104.3 kg, SpO2 91 %.  I/O:    Intake/Output Summary (Last 24 hours) at 07/24/2020 0803 Last data filed at 07/23/2020 1329 Gross per 24 hour  Intake --  Output 325 ml  Net -325 ml    PHYSICAL EXAMINATION:  GENERAL:  60 y.o.-year-old patient lying in the bed with no acute distress. Obese  LUNGS: distant breath sounds bilaterally, no wheezing, rales,rhonchi or crepitation. No use of accessory muscles of respiration.  CARDIOVASCULAR: S1, S2 normal. No murmurs, rubs, or gallops.  ABDOMEN: Soft, non-tender, non-distended. Bowel sounds present. No organomegaly or mass.  EXTREMITIES: No pedal edema, cyanosis, or clubbing.  NEUROLOGIC: Cranial nerves II through XII are intact. Muscle strength 5/5 in all extremities. Sensation intact. Gait not checked.  PSYCHIATRIC: The patient is  alert and oriented x 3.  SKIN: No obvious rash, lesion, or ulcer.   DATA REVIEW:   CBC  Recent Labs  Lab 07/22/20 0225  WBC 8.3  HGB 15.5*  HCT 47.7*  PLT 178    Chemistries  Recent Labs  Lab 07/21/20 2135 07/22/20 0225  NA 139 136  K 3.7 3.4*  CL 106 104  CO2 24 21*  GLUCOSE 122* 218*  BUN 16 19  CREATININE 0.61 0.74  CALCIUM 9.4 9.1  AST 18  --   ALT 26  --   ALKPHOS 70  --   BILITOT 0.8  --     Microbiology Results   Recent Results (from the past 240 hour(s))  Resp Panel by RT-PCR (Flu A&B, Covid) Nasopharyngeal Swab     Status: None   Collection Time: 07/21/20  9:35 PM   Specimen: Nasopharyngeal Swab; Nasopharyngeal(NP) swabs in vial transport medium  Result Value Ref Range Status   SARS Coronavirus 2 by RT PCR NEGATIVE NEGATIVE Final    Comment: (NOTE) SARS-CoV-2 target nucleic acids are NOT DETECTED.  The SARS-CoV-2 RNA is generally detectable in upper respiratory specimens during the acute phase of infection. The lowest concentration of SARS-CoV-2 viral copies this assay can detect is 138 copies/mL. A negative result does not preclude SARS-Cov-2 infection and should not be used as the sole basis for treatment or other patient management decisions. A negative result may occur with  improper specimen collection/handling, submission of specimen other than nasopharyngeal swab, presence of viral mutation(s) within the areas targeted by this assay, and inadequate number of viral copies(<138 copies/mL). A negative result must be combined with clinical observations, patient history, and epidemiological information. The expected result is Negative.  Fact Sheet for Patients:  EntrepreneurPulse.com.au  Fact Sheet for Healthcare Providers:  IncredibleEmployment.be  This test is no t yet approved or cleared by the [place] FDA and  has been authorized for detection and/or diagnosis of SARS-CoV-2 by FDA under an  Emergency Use Authorization (EUA). This EUA will remain  in effect (meaning this test can be used) for the duration of the COVID-19 declaration under Section 564(b)(1) of the Act, 21 U.S.C.section 360bbb-3(b)(1), unless the authorization is terminated  or revoked sooner.       Influenza A by PCR NEGATIVE NEGATIVE Final   Influenza B by PCR NEGATIVE NEGATIVE Final    Comment: (NOTE) The Xpert Xpress SARS-CoV-2/FLU/RSV plus assay is intended as an aid in the diagnosis of influenza from Nasopharyngeal swab specimens and should not be used as a sole basis for treatment. Nasal washings and aspirates are unacceptable for Xpert Xpress SARS-CoV-2/FLU/RSV testing.  Fact  Sheet for Patients: EntrepreneurPulse.com.au  Fact Sheet for Healthcare Providers: IncredibleEmployment.be  This test is not yet approved or cleared by the [place] FDA and has been authorized for detection and/or diagnosis of SARS-CoV-2 by FDA under an Emergency Use Authorization (EUA). This EUA will remain in effect (meaning this test can be used) for the duration of the COVID-19 declaration under Section 564(b)(1) of the Act, 21 U.S.C. section 360bbb-3(b)(1), unless the authorization is terminated or revoked.  Performed at [facility], [address] [place], [place] [address]   Culture, blood (Routine X 2) w Reflex to ID Panel     Status: None (Preliminary result)   Collection Time: 07/22/20  1:58 AM   Specimen: BLOOD  Result Value Ref Range Status   Specimen Description BLOOD RIGHT HAND  Final   Special Requests   Final    BOTTLES DRAWN AEROBIC AND ANAEROBIC Blood Culture adequate volume   Culture   Final    NO GROWTH 2 DAYS Performed at [facility], [address], [place], [place] [address]    Report Status PENDING  Incomplete  Culture, blood (Routine X 2) w Reflex to ID Panel     Status: None (Preliminary result)   Collection Time: 07/22/20   2:25 AM   Specimen: BLOOD  Result Value Ref Range Status   Specimen Description BLOOD RIGHT HAND  Final   Special Requests   Final    BOTTLES DRAWN AEROBIC AND ANAEROBIC Blood Culture adequate volume   Culture   Final    NO GROWTH 2 DAYS Performed at [facility], [address] [place], [place] [address]    Report Status PENDING  Incomplete  MRSA PCR Screening     Status: None   Collection Time: 07/22/20  9:25 AM   Specimen: Nasopharyngeal  Result Value Ref Range Status   MRSA by PCR NEGATIVE NEGATIVE Final    Comment:        The GeneXpert MRSA Assay (FDA approved for NASAL specimens only), is one component of a comprehensive MRSA colonization surveillance program. It is not intended to diagnose MRSA infection nor to guide or monitor treatment for MRSA infections. Performed at [facility], [place], [place] [address]   Respiratory (~20 pathogens) panel by PCR     Status: Abnormal   Collection Time: 07/22/20  4:58 PM   Specimen: Nasopharyngeal Swab; Respiratory  Result Value Ref Range Status   Adenovirus NOT DETECTED NOT DETECTED Final   Coronavirus 229E NOT DETECTED NOT DETECTED Final    Comment: (NOTE) The Coronavirus on the Respiratory Panel, DOES NOT test for the novel  Coronavirus (2019 nCoV)    Coronavirus HKU1 NOT DETECTED NOT DETECTED Final   Coronavirus NL63 NOT DETECTED NOT DETECTED Final   Coronavirus OC43 NOT DETECTED NOT DETECTED Final   Metapneumovirus NOT DETECTED NOT DETECTED Final   Rhinovirus / Enterovirus DETECTED (A) NOT DETECTED Final   Influenza A NOT DETECTED NOT DETECTED Final   Influenza B NOT DETECTED NOT DETECTED Final   Parainfluenza Virus 1 NOT DETECTED NOT DETECTED Final   Parainfluenza Virus 2 NOT DETECTED NOT DETECTED Final   Parainfluenza Virus 3 NOT DETECTED NOT DETECTED Final   Parainfluenza Virus 4 NOT DETECTED NOT DETECTED Final   Respiratory Syncytial Virus NOT DETECTED NOT DETECTED Final    Bordetella pertussis NOT DETECTED NOT DETECTED Final   Bordetella Parapertussis NOT DETECTED NOT DETECTED Final   Chlamydophila pneumoniae NOT DETECTED NOT DETECTED Final  Mycoplasma pneumoniae NOT DETECTED NOT DETECTED Final    Comment: Performed at [facility], [place] [address], [place], [place] [address]  Expectorated sputum assessment w rflx to resp cult     Status: None   Collection Time: 07/22/20  4:59 PM   Specimen: Expectorated Sputum  Result Value Ref Range Status   Specimen Description EXPECTORATED SPUTUM  Final   Special Requests NONE  Final   Sputum evaluation   Final    THIS SPECIMEN IS ACCEPTABLE FOR SPUTUM CULTURE Performed at [facility], [address], [place], [place] [address]    Report Status 07/23/2020 FINAL  Final  Culture, Respiratory w Gram Stain     Status: None (Preliminary result)   Collection Time: 07/22/20  4:59 PM  Result Value Ref Range Status   Specimen Description   Final    EXPECTORATED SPUTUM Performed at [facility], [address], [place], [place] [address]    Special Requests   Final    NONE Reflexed from [id number] Performed at [facility], [address] [place], [place] [address]    Gram Stain   Final    FEW WBC PRESENT,BOTH PMN AND MONONUCLEAR RARE GRAM POSITIVE COCCI RARE YEAST Performed at [facility], [place] [address], [place],  [address]    Culture PENDING  Incomplete   Report Status PENDING  Incomplete    RADIOLOGY:  No results found.   CODE STATUS:     Code Status Orders  (From admission, onward)         Start     Ordered   07/22/20 0019  Full code  Continuous        07/22/20 0025        Code Status History    Date Active Date Inactive Code Status Order ID Comments User Context   06/16/2015 1434 06/18/2015 1737 Full Code [id number]  Gladstone Lighter, MD Inpatient   Advance Care Planning Activity       TOTAL TIME TAKING CARE OF THIS PATIENT:40* minutes.     Fritzi Mandes M.D  [facility]    CC: Primary care physician; Dayton Martes Victoriano Lain, NP

## 2020-07-27 LAB — CULTURE, BLOOD (ROUTINE X 2)
Culture: NO GROWTH
Culture: NO GROWTH
Special Requests: ADEQUATE
Special Requests: ADEQUATE

## 2020-10-02 ENCOUNTER — Other Ambulatory Visit: Payer: Self-pay

## 2020-10-02 ENCOUNTER — Ambulatory Visit
Admission: EM | Admit: 2020-10-02 | Discharge: 2020-10-02 | Disposition: A | Discharge status: Home / Self Care | Payer: 59 | Attending: Orthopedic Surgery | Admitting: Orthopedic Surgery

## 2020-10-02 DIAGNOSIS — R300 Dysuria: Secondary | ICD-10-CM | POA: Insufficient documentation

## 2020-10-02 DIAGNOSIS — R109 Unspecified abdominal pain: Secondary | ICD-10-CM | POA: Diagnosis not present

## 2020-10-02 LAB — POCT URINALYSIS DIP (DEVICE)
Bilirubin Urine: NEGATIVE
Glucose, UA: NEGATIVE mg/dL
Ketones, ur: NEGATIVE mg/dL
Nitrite: NEGATIVE
Protein, ur: 30 mg/dL — AB
Specific Gravity, Urine: <=1.005 (ref 1.005–1.030)
Urobilinogen, UA: 0.2 mg/dL (ref 0.0–1.0)
pH: 6 (ref 5.0–8.0)

## 2020-10-02 MED ORDER — ONDANSETRON 4 MG PO TBDP: 4.0000 mg | ORAL_TABLET | Freq: Three times a day (TID) | ORAL | 0 refills | Status: DC | PRN

## 2020-10-02 MED ORDER — CEPHALEXIN 500 MG PO CAPS: 500.0000 mg | ORAL_CAPSULE | Freq: Four times a day (QID) | ORAL | 0 refills | Status: AC

## 2020-10-02 MED ORDER — ONDANSETRON 4 MG PO TBDP
4.0000 mg | ORAL_TABLET | Freq: Once | ORAL | Status: AC
  Administered 2020-10-02: 4 mg via ORAL

## 2020-10-02 MED ORDER — HYDROCODONE-ACETAMINOPHEN 5-325 MG PO TABS: 1.0000 | ORAL_TABLET | Freq: Four times a day (QID) | ORAL | 0 refills | Status: DC | PRN

## 2020-10-02 MED ORDER — KETOROLAC TROMETHAMINE 60 MG/2ML IM SOLN
30.0000 mg | Freq: Once | INTRAMUSCULAR | Status: AC
  Administered 2020-10-02: 30 mg via INTRAMUSCULAR

## 2020-10-02 NOTE — ED Provider Notes (Signed)
[facility]    CSN: [id number] Arrival date & time: 10/02/20  [id number]      History   Chief Complaint Chief Complaint  Patient presents with   Urinary Frequency    HPI Heather Nguyen is a 60 y.o. female presents to the urgent care for evaluation of dysuria and hematuria.  Symptoms been present for a couple days.  She has noticed an increase in urinary frequency.  Had similar symptoms several months ago, PCP ordered urinalysis that was negative.  She does admit to having some right-sided flank pain along with her nausea.  No fevers, chills, body aches, vomiting.  Tolerating p.o. well.  She has had some ibuprofen with very little relief.  She denies any vaginal discharge or bleeding.  No generalized abdominal pain, back pain.  No trauma or injury.  No rashes.  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 Authorizing Provider  albuterol (PROVENTIL HFA;VENTOLIN HFA) 108 (90 Base) MCG/ACT inhaler Inhale 2 puffs into the lungs every 6 (six) hours as needed for wheezing or shortness of breath.   Yes [provider]  atorvastatin (LIPITOR) 40 MG tablet Take 40 mg by mouth daily. 09/19/17  Yes [provider]  cephALEXin (KEFLEX) 500 MG capsule Take 1 capsule  (500 mg total) by mouth 4 (four) times daily for 7 days. 10/02/20 10/09/20 Yes Duanne Guess, PA-C  Fluticasone-Salmeterol (ADVAIR DISKUS) 250-50 MCG/DOSE AEPB Inhale 1 puff into the lungs 2 (two) times daily. 06/18/15  Yes Demetrios Loll, MD  HYDROcodone-acetaminophen [facility]) 5-325 MG tablet Take 1 tablet by mouth every 6 (six) hours as needed for moderate pain. 10/02/20  Yes Duanne Guess, PA-C  ibuprofen (ADVIL,MOTRIN) 200 MG tablet Take 400 mg by mouth every 6 (six) hours as needed for headache or mild pain. Reported on 07/07/2015   Yes [provider]  loratadine (CLARITIN) 10 MG tablet Take 10 mg by mouth daily. 06/02/20  Yes [provider]  losartan (COZAAR) 100 MG tablet Take 100 mg by mouth daily. 02/08/20  Yes [provider]  metFORMIN (GLUCOPHAGE-XR) 500 MG 24 hr tablet Take 500 mg by mouth 2 (two) times daily. 09/19/17  Yes [provider]  montelukast (SINGULAIR) 10 MG tablet Take 10 mg by mouth at bedtime. 09/17/17  Yes [provider]  nicotine (NICODERM CQ - DOSED IN MG/24 HR) 7 mg/24hr patch Place 1 patch onto the skin daily. 02/05/19  Yes  [provider]  omeprazole (PRILOSEC) 40 MG capsule Take 40 mg by mouth daily. 02/08/20  Yes [provider]  ondansetron (ZOFRAN ODT) 4 MG disintegrating tablet Take 1 tablet (4 mg total) by mouth every 8 (eight) hours as needed for nausea or vomiting. 10/02/20  Yes Duanne Guess, PA-C  triamcinolone ointment (KENALOG) 0.1 % Apply topically. 02/16/20  Yes [provider]  dextromethorphan-guaiFENesin (MUCINEX DM) 30-600 MG 12hr tablet Take 1 tablet by mouth 2 (two) times daily as needed for cough. 07/24/20   Fritzi Mandes, MD    Family History Family History  Problem Relation Age of Onset   CAD Father    Asthma Mother    Breast cancer Cousin 70       pat cousin    Social History Social History   Tobacco Use   Smoking status: Former    Packs/day: 1.00    Years: 35.00     Pack years: 35.00    Types: Cigarettes   Smokeless tobacco: Never  Substance Use Topics   Alcohol use: No    Alcohol/week: 0.0 standard drinks   Drug use: No     Allergies   Codeine   Review of Systems Review of Systems  Constitutional:  Negative for chills and fever.  Gastrointestinal:  Positive for nausea. Negative for vomiting.  Genitourinary:  Positive for dysuria, flank pain, frequency, hematuria and urgency. Negative for difficulty urinating, vaginal bleeding and vaginal discharge.  Musculoskeletal:  Negative for back pain.  Skin:  Negative for rash and wound.    Physical Exam Triage Vital Signs ED Triage Vitals  Enc Vitals Group     BP 10/02/20 0910 (!) 187/84     Pulse Rate 10/02/20 0910 74     Resp 10/02/20 0910 (!) 24     Temp 10/02/20 0910 97.8 F (36.6 C)     Temp Source 10/02/20 0910 Oral     SpO2 10/02/20 0910 95 %     Weight 10/02/20 0907 240 lb (108.9 kg)     Height 10/02/20 0907 '5\' 1"'$  (1.549 m)     Head Circumference --      Peak Flow --      Pain Score 10/02/20 0906 8     Pain Loc --      Pain Edu? --      Excl. in [place]? --    No data found.  Updated Vital Signs BP (!) 187/84 (BP Location: Right Arm)   Pulse 74   Temp 97.8 F (36.6 C) (Oral)   Resp (!) 24   Ht '5\' 1"'$  (1.549 m)   Wt 240 lb (108.9 kg)   SpO2 95%   BMI 45.35 kg/m   Visual Acuity Right Eye Distance:   Left Eye Distance:   Bilateral Distance:    Right Eye Near:   Left Eye Near:    Bilateral Near:     Physical Exam Constitutional:      Appearance: She is well-developed.  HENT:     Head: Normocephalic and atraumatic.  Eyes:     Conjunctiva/sclera: Conjunctivae normal.  Cardiovascular:     Rate and Rhythm: Normal rate.  Pulmonary:     Effort: Pulmonary effort is normal. No respiratory distress.  Abdominal:     General: There is no distension.     Palpations: Abdomen is soft. There is no mass.     Tenderness: There is no abdominal tenderness. There is right CVA  tenderness (minimal). There is no guarding  or rebound.     Hernia: No hernia is present.  Musculoskeletal:        General: Normal range of motion.     Cervical back: Normal range of motion.  Skin:    General: Skin is warm.     Findings: No rash.  Neurological:     Mental Status: She is alert and oriented to person, place, and time.  Psychiatric:        Behavior: Behavior normal.        Thought Content: Thought content normal.     UC Treatments / Results  Labs (all labs ordered are listed, but only abnormal results are displayed) Labs Reviewed  POCT URINALYSIS DIP (DEVICE) - Abnormal; Notable for the following components:      Result Value   Hgb urine dipstick LARGE (*)    Protein, ur 30 (*)    Leukocytes,Ua MODERATE (*)    All other components within normal limits  URINE CULTURE  POCT URINALYSIS DIPSTICK, ED / UC    EKG   Radiology No results found.  Procedures Procedures (including critical care time)  Medications Ordered in UC Medications  ketorolac (TORADOL) injection 30 mg (30 mg Intramuscular Given 10/02/20 0931)  ondansetron (ZOFRAN-ODT) disintegrating tablet 4 mg (4 mg Oral Given 10/02/20 0930)    Initial Impression / Assessment and Plan / UC Course  I have reviewed the triage vital signs and the nursing notes.  Pertinent labs & imaging results that were available during my care of the patient were reviewed by me and considered in my medical decision making (see chart for details).     60 year old female with dysuria and hematuria for couple days.  Also reports some right-sided flank pain.  Dipstick urinalysis positive for some leukocytes but no nitrites.  She did have some hematuria.  Urine culture pending.  We will go ahead and treat with Keflex and also discussed concern for possible kidney stone.  She is given some Norco and Zofran as well as a Toradol injection here in the ED.  She is encouraged to follow-up with PCP tomorrow to schedule follow-up  appointment.  She understands signs and symptoms return to the ER for such as any fevers, increasing pain, nausea vomiting or any urgent changes in her health. Final Clinical Impressions(s) / UC Diagnoses   Final diagnoses:  Dysuria  Right flank pain     Discharge Instructions      Please make sure you are drinking lots of fluids.  Take medications as prescribed.  Urine culture is pending.  Please call PCP to schedule follow-up appointment.  If any increasing pain, worsening symptoms such as fevers, chills, nausea, vomiting, abdominal or pelvic pain please go to the ER.   ED Prescriptions     Medication Sig Dispense Auth. Provider   cephALEXin (KEFLEX) 500 MG capsule Take 1 capsule (500 mg total) by mouth 4 (four) times daily for 7 days. 28 capsule Duanne Guess, PA-C   HYDROcodone-acetaminophen (NORCO) 5-325 MG tablet Take 1 tablet by mouth every 6 (six) hours as needed for moderate pain. 28 tablet Dorise Hiss C, PA-C   ondansetron (ZOFRAN ODT) 4 MG disintegrating tablet Take 1 tablet (4 mg total) by mouth every 8 (eight) hours as needed for nausea or vomiting. 15 tablet Duanne Guess, [place]      I have reviewed the PDMP during this encounter.   Duanne Guess, [place] 10/02/20 [phone]

## 2020-10-02 NOTE — Discharge Instructions (Addendum)
Please make sure you are drinking lots of fluids.  Take medications as prescribed.  Urine culture is pending.  Please call PCP to schedule follow-up appointment.  If any increasing pain, worsening symptoms such as fevers, chills, nausea, vomiting, abdominal or pelvic pain please go to the ER.

## 2020-10-02 NOTE — ED Triage Notes (Signed)
Patient complains of urinary frequency, urgency, hematuria and right flank pain that started yesterday.

## 2020-10-04 LAB — URINE CULTURE

## 2020-10-21 ENCOUNTER — Other Ambulatory Visit (HOSPITAL_COMMUNITY): Payer: Self-pay | Admitting: Gerontology

## 2020-10-21 ENCOUNTER — Other Ambulatory Visit: Payer: Self-pay | Admitting: Gerontology

## 2020-10-21 DIAGNOSIS — R319 Hematuria, unspecified: Secondary | ICD-10-CM

## 2020-10-25 ENCOUNTER — Ambulatory Visit: Payer: Self-pay | Admitting: Urology

## 2020-11-02 ENCOUNTER — Other Ambulatory Visit: Payer: Self-pay

## 2020-11-02 ENCOUNTER — Ambulatory Visit
Admission: RE | Admit: 2020-11-02 | Discharge: 2020-11-02 | Disposition: A | Discharge status: Home / Self Care | Payer: 59 | Source: Ambulatory Visit | Attending: Gerontology | Admitting: Gerontology

## 2020-11-02 DIAGNOSIS — R319 Hematuria, unspecified: Secondary | ICD-10-CM | POA: Insufficient documentation

## 2020-11-02 LAB — POCT I-STAT CREATININE: Creatinine, Ser: 0.8 mg/dL (ref 0.44–1.00)

## 2020-11-02 MED ORDER — IOHEXOL 300 MG/ML  SOLN
150.0000 mL | Freq: Once | INTRAMUSCULAR | Status: AC | PRN
  Administered 2020-11-02: 125 mL via INTRAVENOUS

## 2020-11-08 ENCOUNTER — Encounter: Payer: Self-pay | Admitting: Urology

## 2020-11-08 ENCOUNTER — Other Ambulatory Visit
Admission: RE | Admit: 2020-11-08 | Discharge: 2020-11-08 | Disposition: A | Discharge status: Home / Self Care | Payer: 59 | Attending: Urology | Admitting: Urology

## 2020-11-08 ENCOUNTER — Other Ambulatory Visit: Payer: Self-pay

## 2020-11-08 ENCOUNTER — Other Ambulatory Visit: Payer: Self-pay | Admitting: *Deleted

## 2020-11-08 ENCOUNTER — Ambulatory Visit: Payer: 59 | Admitting: Urology

## 2020-11-08 VITALS — BP 158/88 | HR 78 | Ht 61.0 in | Wt 234.0 lb

## 2020-11-08 DIAGNOSIS — R310 Gross hematuria: Secondary | ICD-10-CM | POA: Diagnosis present

## 2020-11-08 DIAGNOSIS — N3281 Overactive bladder: Secondary | ICD-10-CM | POA: Diagnosis not present

## 2020-11-08 LAB — URINALYSIS, COMPLETE (UACMP) WITH MICROSCOPIC
Bilirubin Urine: NEGATIVE
Glucose, UA: NEGATIVE mg/dL
Ketones, ur: NEGATIVE mg/dL
Nitrite: NEGATIVE
Protein, ur: NEGATIVE mg/dL
Specific Gravity, Urine: 1.01 (ref 1.005–1.030)
pH: 5 (ref 5.0–8.0)

## 2020-11-08 MED ORDER — OXYBUTYNIN CHLORIDE ER 10 MG PO TB24: 10.0000 mg | ORAL_TABLET | Freq: Every day | ORAL | 0 refills | Status: DC

## 2020-11-08 NOTE — Progress Notes (Signed)
   11/08/20 11:43 AM   Valeria Batman 06-26-1960 [id number]  CC: Gross hematuria, overactive bladder  HPI: I saw Ms. Tolsma in clinic for the above issues.  She is a 60 year old female with a distant 15-pack-year smoking history who had an episode of gross hematuria and pelvic pain in late July 2022.  She was seen in urgent care and reportedly thought to have a UTI and started on antibiotics with improvement in her symptoms.  She was told her culture was negative and she should stop antibiotics.  She has not had any gross hematuria since that time.  She has some baseline overactive bladder symptoms with urinary urgency and frequency.  She has significant nocturia every hour and a half overnight that is very bothersome.  She has never tried bladder medications.  Urinalysis today contaminated with 6-10 squamous cells, 20-50 WBCs, 6-10 RBCs, many bacteria, small leukocytes.  A CT abdomen and pelvis with and without contrast was ordered by PCP on 11/02/2020 for the gross hematuria and was essentially benign aside from some mild bladder wall thickening.   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 Her smoking use included cigarettes. She has a 35.00 pack-year smoking history. She has never used smokeless tobacco. She reports that she does not drink alcohol and does not use drugs.  Physical Exam: BP (!) 158/88   Pulse 78   Ht '5\' 1"'$  (1.549 m)   Wt 234 lb (106.1 kg)   BMI 44.21 kg/m    Constitutional:  Alert and oriented, No acute distress. Cardiovascular: No clubbing, cyanosis,  or edema. Respiratory: Normal respiratory effort, no increased work of breathing. GI: Abdomen is soft, nontender, nondistended, no abdominal masses  Laboratory Data: Reviewed, see HPI  Pertinent Imaging: I have personally viewed and interpreted the CT dated 11/02/2020 showing some mild asymmetric bladder wall thickening but otherwise benign.  Assessment & Plan:   60 year old female with single episode of gross hematuria that in hindsight was most likely secondary to UTI.  CT essentially benign aside from some mild asymmetric bladder wall thickening.  With her smoking history and significant anxiety regarding possible bladder cancer diagnosis, I recommended cystoscopy to complete work-up.  Regarding her urinary symptoms, we discussed behavioral strategies, and I also recommended a trial of oxybutynin 10 mg XL for her urgency, frequency, nocturia.  RTC for cystoscopy to complete hematuria work-up  Nickolas Madrid, MD 11/08/2020  [facility] [address] [place], [place] [address] [phone]

## 2020-11-08 NOTE — Patient Instructions (Signed)
Overactive Bladder, Adult °Overactive bladder is a condition in which a person has a sudden and frequent need to urinate. A person might also leak urine if he or she cannot get to the bathroom fast enough (urinary incontinence). Sometimes, symptoms can interfere with work or social activities. °What are the causes? °Overactive bladder is associated with poor nerve signals between your bladder and your brain. Your bladder may get the signal to empty before it is full. You may also have very sensitive muscles that make your bladder squeeze too soon. This condition may also be caused by other factors, such as: °Medical conditions: °Urinary tract infection. °Infection of nearby tissues. °Prostate enlargement. °Bladder stones, inflammation, or tumors. °Diabetes. °Muscle or nerve weakness, especially from these conditions: °A spinal cord injury. °Stroke. °Multiple sclerosis. °Parkinson's disease. °Other causes: °Surgery on the uterus or urethra. °Drinking too much caffeine or alcohol. °Certain medicines, especially those that eliminate extra fluid in the body (diuretics). °Constipation. °What increases the risk? °You may be at greater risk for overactive bladder if you: °Are an older adult. °Smoke. °Are going through menopause. °Have prostate problems. °Have a neurological disease, such as stroke, dementia, Parkinson's disease, or multiple sclerosis (MS). °Eat or drink alcohol, spicy food, caffeine, and other things that irritate the bladder. °Are overweight or obese. °What are the signs or symptoms? °Symptoms of this condition include a sudden, strong urge to urinate. Other symptoms include: °Leaking urine. °Urinating 8 or more times a day. °Waking up to urinate 2 or more times overnight. °How is this diagnosed? °This condition may be diagnosed based on: °Your symptoms and medical history. °A physical exam. °Blood or urine tests to check for possible causes, such as infection. °You may also need to see a health care  provider who specializes in urinary tract problems. This is called a urologist. °How is this treated? °Treatment for overactive bladder depends on the cause of your condition and whether it is mild or severe. Treatment may include: °Bladder training, such as: °Learning to control the urge to urinate by following a schedule to urinate at regular intervals. °Doing Kegel exercises to strengthen the pelvic floor muscles that support your bladder. °Special devices, such as: °Biofeedback. This uses sensors to help you become aware of your body's signals. °Electrical stimulation. This uses electrodes placed inside the body (implanted) or outside the body. These electrodes send gentle pulses of electricity to strengthen the nerves or muscles that control the bladder. °Women may use a plastic device, called a pessary, that fits into the vagina and supports the bladder. °Medicines, such as: °Antibiotics to treat bladder infection. °Antispasmodics to stop the bladder from releasing urine at the wrong time. °Tricyclic antidepressants to relax bladder muscles. °Injections of botulinum toxin type A directly into the bladder tissue to relax bladder muscles. °Surgery, such as: °A device may be implanted to help manage the nerve signals that control urination. °An electrode may be implanted to stimulate electrical signals in the bladder. °A procedure may be done to change the shape of the bladder. This is done only in very severe cases. °Follow these instructions at home: °Eating and drinking ° °Make diet or lifestyle changes recommended by your health care provider. These may include: °Drinking fluids throughout the day and not only with meals. °Cutting down on caffeine or alcohol. °Eating a healthy and balanced diet to prevent constipation. This may include: °Choosing foods that are high in fiber, such as beans, whole grains, and fresh fruits and vegetables. °Limiting foods that are   high in fat and processed sugars, such as fried  and sweet foods. Lifestyle  Lose weight if needed. Do not use any products that contain nicotine or tobacco. These include cigarettes, chewing tobacco, and vaping devices, such as e-cigarettes. If you need help quitting, ask your health care provider. General instructions Take over-the-counter and prescription medicines only as told by your health care provider. If you were prescribed an antibiotic medicine, take it as told by your health care provider. Do not stop taking the antibiotic even if you start to feel better. Use any implants or pessary as told by your health care provider. If needed, wear pads to absorb urine leakage. Keep a log to track how much and when you drink, and when you need to urinate. This will help your health care provider monitor your condition. Keep all follow-up visits. This is important. Contact a health care provider if: You have a fever or chills. Your symptoms do not get better with treatment. Your pain and discomfort get worse. You have more frequent urges to urinate. Get help right away if: You are not able to control your bladder. Summary Overactive bladder refers to a condition in which a person has a sudden and frequent need to urinate. Several conditions may lead to an overactive bladder. Treatment for overactive bladder depends on the cause and severity of your condition. Making lifestyle changes, doing Kegel exercises, keeping a log, and taking medicines can help with this condition. This information is not intended to replace advice given to you by your health care provider. Make sure you discuss any questions you have with your health care provider. Document Revised: 11/09/2019 Document Reviewed: 11/09/2019 Elsevier Patient Education  2022 [place].  Cystoscopy Cystoscopy is a procedure that is used to help diagnose and sometimes treat conditions that affect the lower urinary tract. The lower urinary tract includes the bladder and the  urethra. The urethra is the tube that drains urine from the bladder. Cystoscopy is done using a thin, tube-shaped instrument with a light and camera at the end (cystoscope). The cystoscope may be hard or flexible, depending on the goal of the procedure. The cystoscope is inserted through the urethra, into the bladder. Cystoscopy may be recommended if you have: Urinary tract infections that keep coming back. Blood in the urine (hematuria). An inability to control when you urinate (urinary incontinence) or an overactive bladder. Unusual cells found in a urine sample. A blockage in the urethra, such as a urinary stone. Painful urination. An abnormality in the bladder found during an intravenous pyelogram (IVP) or CT scan. Cystoscopy may also be done to remove a sample of tissue to be examined under a microscope (biopsy). Tell a health care provider about: Any allergies you have. All medicines you are taking, including vitamins, herbs, eye drops, creams, and over-the-counter medicines. Any problems you or family members have had with anesthetic medicines. Any blood disorders you have. Any surgeries you have had. Any medical conditions you have. Whether you are pregnant or may be pregnant. What are the risks? Generally, this is a safe procedure. However, problems may occur, including: Infection. Bleeding. Allergic reactions to medicines. Damage to other structures or organs. What happens before the procedure? Medicines Ask your health care provider about: Changing or stopping your regular medicines. This is especially important if you are taking diabetes medicines or blood thinners. Taking medicines such as aspirin and ibuprofen. These medicines can thin your blood. Do not take these medicines unless your health care provider  tells you to take them. Taking over-the-counter medicines, vitamins, herbs, and supplements. Tests You may have an exam or testing, such as: X-rays of the bladder,  urethra, or kidneys. CT scan of the abdomen or pelvis. Urine tests to check for signs of infection. General instructions Follow instructions from your health care provider about eating or drinking restrictions. Ask your health care provider what steps will be taken to help prevent infection. These steps may include: Washing skin with a germ-killing soap. Taking antibiotic medicine. Plan to have a responsible adult take you home from the hospital or clinic. What happens during the procedure?  You will be given one or more of the following: A medicine to help you relax (sedative). A medicine to numb the area (local anesthetic). The area around the opening of your urethra will be cleaned. The cystoscope will be passed through your urethra into your bladder. Germ-free (sterile) fluid will flow through the cystoscope to fill your bladder. The fluid will stretch your bladder so that your health care provider can clearly examine your bladder walls. Your doctor will look at the urethra and bladder. Your doctor may take a biopsy or remove stones. The cystoscope will be removed, and your bladder will be emptied. The procedure may vary among health care providers and hospitals. What can I expect after the procedure? After the procedure, it is common to have: Some soreness or pain in your abdomen and urethra. Urinary symptoms. These include: Mild pain or burning when you urinate. Pain should stop within a few minutes after you urinate. This may last for up to 1 week. A small amount of blood in your urine for several days. Feeling like you need to urinate but producing only a small amount of urine. Follow these instructions at home: Medicines Take over-the-counter and prescription medicines only as told by your health care provider. If you were prescribed an antibiotic medicine, take it as told by your health care provider. Do not stop taking the antibiotic even if you start to feel better. General  instructions Return to your normal activities as told by your health care provider. Ask your health care provider what activities are safe for you. If you were given a sedative during the procedure, it can affect you for several hours. Do not drive or operate machinery until your health care provider says that it is safe. Watch for any blood in your urine. If the amount of blood in your urine increases, call your health care provider. Follow instructions from your health care provider about eating or drinking restrictions. If a tissue sample was removed for testing (biopsy) during your procedure, it is up to you to get your test results. Ask your health care provider, or the department that is doing the test, when your results will be ready. Drink enough fluid to keep your urine pale yellow. Keep all follow-up visits. This is important. Contact a health care provider if: You have pain that gets worse or does not get better with medicine, especially pain when you urinate. You have trouble urinating. You have more blood in your urine. Get help right away if: You have blood clots in your urine. You have abdominal pain. You have a fever or chills. You are unable to urinate. Summary Cystoscopy is a procedure that is used to help diagnose and sometimes treat conditions that affect the lower urinary tract. Cystoscopy is done using a thin, tube-shaped instrument with a light and camera at the end. After the procedure, it is  common to have some soreness or pain in your abdomen and urethra. Watch for any blood in your urine. If the amount of blood in your urine increases, call your health care provider. If you were prescribed an antibiotic medicine, take it as told by your health care provider. Do not stop taking the antibiotic even if you start to feel better. This information is not intended to replace advice given to you by your health care provider. Make sure you discuss any questions you have with  your health care provider. Document Revised: 10/02/2019 Document Reviewed: 10/02/2019 Elsevier Patient Education  [place].

## 2020-11-29 ENCOUNTER — Ambulatory Visit: Payer: Self-pay | Admitting: Urology

## 2020-12-05 ENCOUNTER — Other Ambulatory Visit: Payer: Self-pay | Admitting: Urology

## 2020-12-08 ENCOUNTER — Ambulatory Visit: Payer: 59 | Admitting: Urology

## 2020-12-08 ENCOUNTER — Encounter: Payer: Self-pay | Admitting: Urology

## 2020-12-08 ENCOUNTER — Other Ambulatory Visit: Payer: Self-pay | Admitting: Urology

## 2020-12-08 ENCOUNTER — Telehealth: Payer: Self-pay | Admitting: Urology

## 2020-12-08 ENCOUNTER — Other Ambulatory Visit: Payer: Self-pay

## 2020-12-08 VITALS — BP 166/97 | HR 80 | Ht 62.0 in | Wt 230.0 lb

## 2020-12-08 DIAGNOSIS — R310 Gross hematuria: Secondary | ICD-10-CM

## 2020-12-08 DIAGNOSIS — N329 Bladder disorder, unspecified: Secondary | ICD-10-CM

## 2020-12-08 NOTE — Progress Notes (Signed)
Cystoscopy Procedure Note:  Indication: Gross hematuria, overactive bladder symptoms  After informed consent and discussion of the procedure and its risks, Heather Nguyen was positioned and prepped in the standard fashion. Cystoscopy was performed with a flexible cystoscope. The urethra, bladder neck and entire bladder was visualized in a standard fashion. The ureteral orifices were visualized in their normal location and orientation.  There was erythema and some papillary changes throughout the bladder, primarily at the trigone, worrisome for possible urothelial cell carcinoma versus CIS versus inflammation.  Retroflexion with most of the papillary changes and erythema visualized.  Imaging: CT abdomen pelvis with contrast 11/02/2020 benign aside from mild bladder wall thickening  Findings: Papillary and erythematous changes in the bladder, especially on retroflexion worrisome for possible urothelial cell carcinoma versus CIS  ----------------------------------------------------------------  Assessment and Plan: 60 year old female with gross hematuria and overactive bladder symptoms, recent cultures negative.  We trialed oxybutynin 10 mg XL at her last visit, which has improved some of her urgency and frequency.  Cystoscopy today with some suspicious findings within the bladder worrisome for possible early urothelial cell carcinoma versus CIS, and certainly warrant biopsy. We discussed bladder biopsy and fulguration and risks and benefits at length. This is typically a 1 hour procedure done under general anesthesia in the operating room.  A scope is inserted through the urethra and used to resect abnormal tissue within the bladder, which is then sent to the pathologist to determine grade and stage of the tumor.  Risks include bleeding, infection, need for temporary Foley placement, and bladder perforation.  Treatment strategies are based on the type of tumor and depth of invasion.    Schedule  cystoscopy, bladder biopsy, and fulguration with gemcitabine   Nickolas Madrid, MD 12/08/2020

## 2020-12-08 NOTE — Progress Notes (Signed)
Surgical Physician Order Form  ** Schedulqing expectation : Next Available  *Length of Case: 1 hour  *Clearance needed: no  *Anticoagulation Instructions: Hold all anticoagulants  *Aspirin Instructions: Hold Aspirin  *Post-op visit Date/Instructions:   1-2 week MD discuss pathology  *Diagnosis: Bladder Lesion  *Procedure: Cysto Bladder Biopsy (41712) and gemcitabine  -Admit type: OUTpatient  -Anesthesia: General  -VTE Prophylaxis Standing Order SCD's       Other:   -Standing Lab Orders Per Anesthesia    Lab other: UA&Urine Culture  -Standing Test orders EKG/Chest x-ray per Anesthesia       Test other:   - Medications:     Ancef 2gm IV   Other Instructions:

## 2020-12-08 NOTE — Telephone Encounter (Signed)
Per Dr. Isabel Nguyen Patient is to be scheduled for Cysto Bladder Biopsy with Gemcitabine  Mrs. Heather Nguyen was seen by surgery coordinator Denny Peon today and possible surgical dates were discussed, 12/30/20 was agreed upon for surgery.   Patient was directed to call [phone] between 1-3pm the day before surgery to find out surgical arrival time.  Instructions were given not to eat or drink from midnight on the night before surgery and have a driver for the day of surgery. On the surgery day patient was instructed to enter through the [place] entrance of [facility] report the Same Day Surgery desk.   Pre-Admit Testing will be in contact via phone to set up an interview with the anesthesia team to review your history and medications prior to surgery.    Patient is to hold anticoag's and asa per Dr. Diamantina Providence.

## 2020-12-09 LAB — CYTOLOGY - NON PAP

## 2020-12-09 NOTE — Progress Notes (Signed)
[facility] Posting Form   Surgery Date/Time: Date: 12/30/2020  Surgeon: Dr. Nickolas Madrid, MD  Surgery Location: Day Surgery  Inpt ( No  )   Outpt (Yes)   Obs ( No  )   Diagnosis: N32.9 Bladder Lesion  -CPT: 18867  Surgery: Cystoscopy with Bladder Biopsy  -CPT: 51720  Surgery: Instillation of Gemcitabine  Stop Anticoagulations: No  Cardiac/Medical/Pulmonary Clearance needed: No   *Orders entered into EPIC  Date: 12/09/2020    *Case booked in [place]  Date: 12/08/2020  *Notified pt of Surgery: Date: 12/08/2020  PRE-OP UA & CX: Yes  *Placed into Prior Authorization Work Que Date: 12/09/20   Assistant/laser/rep:No

## 2020-12-13 ENCOUNTER — Other Ambulatory Visit: Payer: Self-pay | Admitting: Urology

## 2020-12-14 LAB — CULTURE, URINE COMPREHENSIVE

## 2020-12-27 ENCOUNTER — Other Ambulatory Visit: Payer: Self-pay

## 2020-12-27 ENCOUNTER — Encounter
Admission: RE | Admit: 2020-12-27 | Discharge: 2020-12-27 | Disposition: A | Discharge status: Home / Self Care | Payer: 59 | Source: Ambulatory Visit | Attending: Urology | Admitting: Urology

## 2020-12-27 HISTORY — DX: Chronic obstructive pulmonary disease, unspecified: J44.9

## 2020-12-27 HISTORY — DX: Hyperlipidemia, unspecified: E78.5

## 2020-12-27 HISTORY — DX: Pneumonia, unspecified organism: J18.9

## 2020-12-27 NOTE — Patient Instructions (Addendum)
Your procedure is scheduled on: Friday, October 28 Report to the Registration Desk on the 1st floor of the [facility]. To find out your arrival time, please call [phone] between 1PM - 3PM on: Thursday, October 27  REMEMBER: Instructions that are not followed completely may result in serious medical risk, up to and including death; or upon the discretion of your surgeon and anesthesiologist your surgery may need to be rescheduled.  Do not eat or drink after midnight the night before surgery.  No gum chewing, lozengers or hard candies.  TAKE THESE MEDICATIONS THE MORNING OF SURGERY WITH A SIP OF WATER:  Albuterol inhaler Omeprazole (Prilosec) - (take one the night before and one on the morning of surgery - helps to prevent nausea after surgery.)  Use inhalers on the day of surgery and bring to the hospital.  Stop Metformin 2 days prior to surgery. Last day to take Metformin is Tuesday, October 25. Resume AFTER surgery.  One week prior to surgery: Stop Anti-inflammatories (NSAIDS) such as Advil, Aleve, Ibuprofen, Motrin, Naproxen, Naprosyn and Aspirin based products such as Excedrin, Goodys Powder, BC Powder. Stop ANY OVER THE COUNTER supplements until after surgery. You may however, continue to take Tylenol if needed for pain up until the day of surgery.  No Alcohol for 24 hours before or after surgery.  No Smoking including e-cigarettes for 24 hours prior to surgery.  No chewable tobacco products for at least 6 hours prior to surgery.  No nicotine patches on the day of surgery.  Do not use any "recreational" drugs for at least a week prior to your surgery.  Please be advised that the combination of cocaine and anesthesia may have negative outcomes, up to and including death. If you test positive for cocaine, your surgery will be cancelled.  On the morning of surgery brush your teeth with toothpaste and water, you may rinse your mouth with mouthwash if you wish. Do not  swallow any toothpaste or mouthwash.  Do not wear jewelry, make-up, hairpins, clips or nail polish.  Do not wear lotions, powders, or perfumes.   Do not shave body from the neck down 48 hours prior to surgery just in case you cut yourself which could leave a site for infection.   Contact lenses, hearing aids and dentures may not be worn into surgery.  Do not bring valuables to the hospital. [facility] is not responsible for any missing/lost belongings or valuables.   Notify your doctor if there is any change in your medical condition (cold, fever, infection).  Wear comfortable clothing (specific to your surgery type) to the hospital.  After surgery, you can help prevent lung complications by doing breathing exercises.  Take deep breaths and cough every 1-2 hours. Your doctor may order a device called an Incentive Spirometer to help you take deep breaths.  If you are being discharged the day of surgery, you will not be allowed to drive home. You will need a responsible adult (18 years or older) to drive you home and stay with you that night.   If you are taking public transportation, you will need to have a responsible adult (18 years or older) with you. Please confirm with your physician that it is acceptable to use public transportation.   Please call the [facility]. at [phone] if you have any questions about these instructions.  Surgery Visitation Policy:  Patients undergoing a surgery or procedure may have one family member or support person with them  as long as that person is not COVID-19 positive or experiencing its symptoms.  That person may remain in the waiting area during the procedure and may rotate out with other people.

## 2020-12-29 MED ORDER — ORAL CARE MOUTH RINSE: 15.0000 mL | Freq: Once | OROMUCOSAL | Status: AC

## 2020-12-29 MED ORDER — CEFAZOLIN SODIUM-DEXTROSE 2-4 GM/100ML-% IV SOLN
2.0000 g | INTRAVENOUS | Status: AC
  Administered 2020-12-30: 2 g via INTRAVENOUS

## 2020-12-29 MED ORDER — SODIUM CHLORIDE 0.9 % IV SOLN: INTRAVENOUS | Status: DC

## 2020-12-29 MED ORDER — CHLORHEXIDINE GLUCONATE 0.12 % MT SOLN: 15.0000 mL | Freq: Once | OROMUCOSAL | Status: AC

## 2020-12-29 MED ORDER — GEMCITABINE CHEMO FOR BLADDER INSTILLATION 2000 MG
2000.0000 mg | Freq: Once | INTRAVENOUS | Status: DC
  Filled 2020-12-29: qty 52.6

## 2020-12-30 ENCOUNTER — Encounter: Payer: Self-pay | Admitting: Urology

## 2020-12-30 ENCOUNTER — Ambulatory Visit: Payer: 59 | Admitting: Anesthesiology

## 2020-12-30 ENCOUNTER — Other Ambulatory Visit: Payer: Self-pay

## 2020-12-30 ENCOUNTER — Ambulatory Visit
Admission: RE | Admit: 2020-12-30 | Discharge: 2020-12-30 | Disposition: A | Discharge status: Home / Self Care | Payer: 59 | Attending: Urology | Admitting: Urology

## 2020-12-30 ENCOUNTER — Encounter
Admission: RE | Disposition: A | Discharge status: Home / Self Care | Payer: Self-pay | Source: Home / Self Care | Attending: Urology

## 2020-12-30 ENCOUNTER — Ambulatory Visit: Payer: 59

## 2020-12-30 ENCOUNTER — Ambulatory Visit: Payer: 59 | Admitting: Urgent Care

## 2020-12-30 DIAGNOSIS — Z87891 Personal history of nicotine dependence: Secondary | ICD-10-CM | POA: Insufficient documentation

## 2020-12-30 DIAGNOSIS — Z803 Family history of malignant neoplasm of breast: Secondary | ICD-10-CM | POA: Diagnosis not present

## 2020-12-30 DIAGNOSIS — N329 Bladder disorder, unspecified: Secondary | ICD-10-CM

## 2020-12-30 DIAGNOSIS — C679 Malignant neoplasm of bladder, unspecified: Secondary | ICD-10-CM | POA: Insufficient documentation

## 2020-12-30 DIAGNOSIS — D090 Carcinoma in situ of bladder: Secondary | ICD-10-CM | POA: Insufficient documentation

## 2020-12-30 HISTORY — PX: CYSTOSCOPY WITH BIOPSY: SHX5122

## 2020-12-30 LAB — GLUCOSE, CAPILLARY
Glucose-Capillary: 116 mg/dL — ABNORMAL HIGH (ref 70–99)
Glucose-Capillary: 129 mg/dL — ABNORMAL HIGH (ref 70–99)

## 2020-12-30 SURGERY — CYSTOSCOPY, WITH BIOPSY
Anesthesia: General

## 2020-12-30 MED ORDER — MIDAZOLAM HCL 2 MG/2ML IJ SOLN
INTRAMUSCULAR | Status: AC
  Filled 2020-12-30: qty 2

## 2020-12-30 MED ORDER — SUGAMMADEX SODIUM 500 MG/5ML IV SOLN
INTRAVENOUS | Status: DC | PRN
Start: 2020-12-30 — End: 2020-12-30
  Administered 2020-12-30: 500 mg via INTRAVENOUS

## 2020-12-30 MED ORDER — FENTANYL CITRATE (PF) 100 MCG/2ML IJ SOLN
INTRAMUSCULAR | Status: DC | PRN
  Administered 2020-12-30: 50 ug via INTRAVENOUS
  Administered 2020-12-30: 25 ug via INTRAVENOUS

## 2020-12-30 MED ORDER — SUCCINYLCHOLINE CHLORIDE 200 MG/10ML IV SOSY
PREFILLED_SYRINGE | INTRAVENOUS | Status: DC | PRN
  Administered 2020-12-30: 120 mg via INTRAVENOUS

## 2020-12-30 MED ORDER — PHENYLEPHRINE HCL-NACL 20-0.9 MG/250ML-% IV SOLN
INTRAVENOUS | Status: DC | PRN
  Administered 2020-12-30: 50 ug/min via INTRAVENOUS

## 2020-12-30 MED ORDER — LIDOCAINE HCL (CARDIAC) PF 100 MG/5ML IV SOSY
PREFILLED_SYRINGE | INTRAVENOUS | Status: DC | PRN
  Administered 2020-12-30: 100 mg via INTRAVENOUS

## 2020-12-30 MED ORDER — CEFAZOLIN SODIUM-DEXTROSE 2-4 GM/100ML-% IV SOLN
INTRAVENOUS | Status: AC
  Filled 2020-12-30: qty 100

## 2020-12-30 MED ORDER — ROCURONIUM BROMIDE 100 MG/10ML IV SOLN
INTRAVENOUS | Status: DC | PRN
  Administered 2020-12-30: 10 mg via INTRAVENOUS

## 2020-12-30 MED ORDER — FENTANYL CITRATE (PF) 100 MCG/2ML IJ SOLN
INTRAMUSCULAR | Status: AC
  Filled 2020-12-30: qty 2

## 2020-12-30 MED ORDER — CHLORHEXIDINE GLUCONATE 0.12 % MT SOLN
OROMUCOSAL | Status: AC
  Administered 2020-12-30: 15 mL via OROMUCOSAL
  Filled 2020-12-30: qty 15

## 2020-12-30 MED ORDER — ONDANSETRON HCL 4 MG/2ML IJ SOLN
INTRAMUSCULAR | Status: DC | PRN
  Administered 2020-12-30: 4 mg via INTRAVENOUS

## 2020-12-30 MED ORDER — PROPOFOL 10 MG/ML IV BOLUS
INTRAVENOUS | Status: AC
  Filled 2020-12-30: qty 20

## 2020-12-30 MED ORDER — GEMCITABINE CHEMO FOR BLADDER INSTILLATION 2000 MG
INTRAVENOUS | Status: DC | PRN
  Administered 2020-12-30: 2000 mg via INTRAVESICAL

## 2020-12-30 MED ORDER — DEXMEDETOMIDINE HCL IN NACL 200 MCG/50ML IV SOLN
INTRAVENOUS | Status: DC | PRN
  Administered 2020-12-30: 8 ug via INTRAVENOUS

## 2020-12-30 MED ORDER — SODIUM CHLORIDE 0.9 % IR SOLN
Status: DC | PRN
  Administered 2020-12-30: 3000 mL
  Administered 2020-12-30: 1500 mL

## 2020-12-30 MED ORDER — DEXAMETHASONE SODIUM PHOSPHATE 10 MG/ML IJ SOLN
INTRAMUSCULAR | Status: DC | PRN
  Administered 2020-12-30: 4 mg via INTRAVENOUS

## 2020-12-30 MED ORDER — MIDAZOLAM HCL 2 MG/2ML IJ SOLN
INTRAMUSCULAR | Status: DC | PRN
  Administered 2020-12-30: 2 mg via INTRAVENOUS

## 2020-12-30 MED ORDER — PROPOFOL 10 MG/ML IV BOLUS
INTRAVENOUS | Status: DC | PRN
  Administered 2020-12-30: 150 mg via INTRAVENOUS

## 2020-12-30 SURGICAL SUPPLY — 26 items
ADAPTER IRRIG TUBE 2 SPIKE SOL (ADAPTER) ×2 IMPLANT
ADPR TBG 2 SPK PMP STRL ASCP (ADAPTER) ×1
BAG DRAIN CYSTO-URO LG1000N (MISCELLANEOUS) ×2 IMPLANT
BRUSH SCRUB EZ  4% CHG (MISCELLANEOUS) ×1
BRUSH SCRUB EZ 4% CHG (MISCELLANEOUS) ×1 IMPLANT
DRSG TELFA 4X3 1S NADH ST (GAUZE/BANDAGES/DRESSINGS) ×2 IMPLANT
ELECT REM PT RETURN 9FT ADLT (ELECTROSURGICAL) ×2
ELECTRODE REM PT RTRN 9FT ADLT (ELECTROSURGICAL) ×1 IMPLANT
GAUZE 4X4 16PLY ~~LOC~~+RFID DBL (SPONGE) ×2 IMPLANT
GLOVE SURG UNDER POLY LF SZ7.5 (GLOVE) ×2 IMPLANT
GOWN STRL REUS W/ TWL LRG LVL3 (GOWN DISPOSABLE) ×1 IMPLANT
GOWN STRL REUS W/ TWL XL LVL3 (GOWN DISPOSABLE) ×1 IMPLANT
GOWN STRL REUS W/TWL LRG LVL3 (GOWN DISPOSABLE) ×2
GOWN STRL REUS W/TWL XL LVL3 (GOWN DISPOSABLE) ×2
IV NS IRRIG 3000ML ARTHROMATIC (IV SOLUTION) ×6 IMPLANT
KIT TURNOVER CYSTO (KITS) ×2 IMPLANT
LOOP CUT BIPOLAR 24F LRG (ELECTROSURGICAL) ×2 IMPLANT
MANIFOLD NEPTUNE II (INSTRUMENTS) IMPLANT
PACK CYSTO AR (MISCELLANEOUS) ×2 IMPLANT
SET CYSTO W/LG BORE CLAMP LF (SET/KITS/TRAYS/PACK) ×2 IMPLANT
SURGILUBE 2OZ TUBE FLIPTOP (MISCELLANEOUS) ×2 IMPLANT
SYR TOOMEY IRRIG 70ML (MISCELLANEOUS) ×2
SYRINGE TOOMEY IRRIG 70ML (MISCELLANEOUS) ×1 IMPLANT
WATER STERILE IRR 1000ML POUR (IV SOLUTION) ×2 IMPLANT
WATER STERILE IRR 3000ML UROMA (IV SOLUTION) ×2 IMPLANT
WATER STERILE IRR 500ML POUR (IV SOLUTION) ×2 IMPLANT

## 2020-12-30 NOTE — Anesthesia Postprocedure Evaluation (Signed)
Anesthesia Post Note  Patient: Heather Nguyen  Procedure(s) Performed: CYSTOSCOPY WITH BIOPSY WITH GEMCITABINE  Patient location during evaluation: PACU Anesthesia Type: General Level of consciousness: awake and alert and oriented Pain management: pain level controlled Vital Signs Assessment: post-procedure vital signs reviewed and stable Respiratory status: spontaneous breathing, nonlabored ventilation and respiratory function stable Cardiovascular status: blood pressure returned to baseline and stable Postop Assessment: no signs of nausea or vomiting Anesthetic complications: no   No notable events documented.   Last Vitals:  Vitals:   12/30/20 1310 12/30/20 1311  BP:  (!) 187/85  Pulse: 78 77  Resp: 18 19  Temp: (!) 36.4 C   SpO2: 95% 95%    Last Pain:  Vitals:   12/30/20 1245  TempSrc:   PainSc: 0-No pain                 Lataisha Colan

## 2020-12-30 NOTE — Discharge Instructions (Addendum)
AMBULATORY SURGERY  DISCHARGE INSTRUCTIONS   The drugs that you were given will stay in your system until tomorrow so for the next 24 hours you should not:  Drive an automobile Make any legal decisions Drink any alcoholic beverage   You may resume regular meals tomorrow.  Today it is better to start with liquids and gradually work up to solid foods.  You may eat anything you prefer, but it is better to start with liquids, then soup and crackers, and gradually work up to solid foods.   Please notify your doctor immediately if you have any unusual bleeding, trouble breathing, redness and pain at the surgery site, drainage, fever, or pain not relieved by medication.       Please contact your physician with any problems or Same Day Surgery at [phone], Monday through Friday 6 am to 4 pm, or [facility] number at [phone].  

## 2020-12-30 NOTE — Transfer of Care (Signed)
Immediate Anesthesia Transfer of Care Note  Patient: Heather Nguyen  Procedure(s) Performed: CYSTOSCOPY WITH BIOPSY WITH GEMCITABINE  Patient Location: PACU  Anesthesia Type:General  Level of Consciousness: drowsy and patient cooperative  Airway & Oxygen Therapy: Patient Spontanous Breathing and Patient connected to face mask oxygen  Post-op Assessment: Report given to RN and Post -op Vital signs reviewed and stable  Post vital signs: Reviewed and stable  Last Vitals:  Vitals Value Taken Time  BP 187/92 12/30/20 1209  Temp 36.5 C 12/30/20 1209  Pulse 79 12/30/20 1212  Resp 21 12/30/20 1212  SpO2 100 % 12/30/20 1212  Vitals shown include unvalidated device data.  Last Pain:  Vitals:   12/30/20 1006  TempSrc: Temporal  PainSc: 0-No pain         Complications: No notable events documented.

## 2020-12-30 NOTE — H&P (Signed)
   12/30/20 10:44 AM   Heather Nguyen 1960/08/30 [id number]  CC: Bladder lesion  HPI: 60 year old female with gross hematuria and overactive bladder symptoms, recent cultures negative.  We trialed oxybutynin 10 mg XL at her last visit, which has improved some of her urgency and frequency.  Cystoscopy with some suspicious findings within the bladder worrisome for possible early urothelial cell carcinoma versus CIS, and certainly warrant biopsy.    PMH: Past Medical History:  Diagnosis Date   Asthma    COPD (chronic obstructive pulmonary disease) ([place])    Diabetes mellitus without complication (HCC)    TYPE 2   Hyperlipidemia    Hypertension    MRSA cellulitis 2014   abdomen   Pneumonia     Surgical History: Past Surgical History:  Procedure Laterality Date   AMPUTATION FINGER Left 1978   4th distal phalanx   TUBAL LIGATION  1990      Family History: Family History  Problem Relation Age of Onset   CAD Father    Asthma Mother    Breast cancer Cousin 41       pat cousin    Social History:  reports that she quit smoking about 4 months ago. Her smoking use included cigarettes. She has a 35.00 pack-year smoking history. She has never used smokeless tobacco. She reports current drug use. Drug: Marijuana. She reports that she does not drink alcohol.  Physical Exam: BP (!) 186/84   Pulse 85   Temp 98.1 F (36.7 C) (Temporal)   Resp 17   Ht 5\' 2"  (1.575 m)   Wt 104.3 kg   SpO2 98%   BMI 42.07 kg/m    Constitutional:  Alert and oriented, No acute distress. Cardiovascular: RRR Respiratory: CTA bilaterally GI: Abdomen is soft, nontender, nondistended, no abdominal masses  Assessment & Plan:   60 year old female with bladder lesion and concern for malignancy.  We discussed bladder biopsy and fulguration and risks and benefits at length. This is typically a 1 hour procedure done under general anesthesia in the operating room.  A scope is inserted through the  urethra and used to resect abnormal tissue within the bladder, which is then sent to the pathologist to determine grade and stage of the tumor.  Risks include bleeding, infection, need for temporary Foley placement, and bladder perforation.  Treatment strategies are based on the type of tumor and depth of invasion.    Cystoscopy bladder biopsy and fulguration, and possible gemcitabine  Nickolas Madrid, MD 12/30/2020  [facility] [address] [place], [place] [address] [phone]

## 2020-12-30 NOTE — Anesthesia Procedure Notes (Signed)
Procedure Name: Intubation Date/Time: 12/30/2020 11:25 AM Performed by: Juliene Pina, CRNA Pre-anesthesia Checklist: Patient identified, Patient being monitored, Timeout performed, Emergency Drugs available and Suction available Patient Re-evaluated:Patient Re-evaluated prior to induction Oxygen Delivery Method: Circle system utilized Preoxygenation: Pre-oxygenation with 100% oxygen Induction Type: IV induction Ventilation: Mask ventilation without difficulty Laryngoscope Size: Mac, 4 and McGraph Grade View: Grade I Tube type: Oral Tube size: 7.0 mm Number of attempts: 1 Airway Equipment and Method: Stylet Placement Confirmation: ETT inserted through vocal cords under direct vision, positive ETCO2 and breath sounds checked- equal and bilateral Secured at: 21 cm Tube secured with: Tape Dental Injury: Teeth and Oropharynx as per pre-operative assessment

## 2020-12-30 NOTE — Op Note (Signed)
Date of procedure: 12/30/20  Preoperative diagnosis:  Bladder tumor  Postoperative diagnosis:  Same  Procedure: TURBT large, >5cm Instillation of gemcitabine  Surgeon: Nickolas Madrid, MD  Anesthesia: General  Complications: None  Intraoperative findings:  Abnormal appearing erythematous and bullous mucosa at the right trigone and posterior lateral wall, close to right ureteral orifice No other suspicious lesions  EBL: Minimal  Specimens: Bladder tumor  Drains: 20 French two-way Foley  Indication: Heather Nguyen is a 60 y.o. patient with irritative urinary symptoms and hematuria who had a negative CT urogram but on cystoscopy had suspicious findings in the bladder worrisome for malignancy.  After reviewing the management options for treatment, they elected to proceed with the above surgical procedure(s). We have discussed the potential benefits and risks of the procedure, side effects of the proposed treatment, the likelihood of the patient achieving the goals of the procedure, and any potential problems that might occur during the procedure or recuperation. Informed consent has been obtained.  Description of procedure:  The patient was taken to the operating room and general anesthesia was induced. SCDs were placed for DVT prophylaxis. The patient was placed in the dorsal lithotomy position, prepped and draped in the usual sterile fashion, and preoperative antibiotics(Ancef) were administered. A preoperative time-out was performed.   A 21 French rigid cystoscope with a 30 degree lens was used to enter the urethra and thorough cystoscopy was performed.  The ureteral orifices were orthotopic bilaterally, and there was some abnormal erythema near the right ureteral orifice.  There was bullous, erythematous, abnormal tissue at the right trigone, right lateral wall, and right posterior wall that measured approximately 6 cm total.  The rest of the bladder appeared normal.  A visual  obturator was used to insert the resectoscope, and a large resecting loop was used to remove all abnormal tissue wrapping from the trigone, right lateral wall, and right posterior wall.  The right ureteral orifice was intact at the conclusion of resection.  Meticulous fulguration and hemostasis was achieved, and there was no bleeding with the bladder decompressed.  All abnormal tissue had been removed.  With the bladder decompressed there was no bleeding.  A 20 French two-way Foley was placed and 10 mL placed in the balloon, and 2 g / 40 mL gemcitabine were instilled into the bladder and the catheter clamped.  Disposition: Stable to PACU  Plan: Drain Foley catheter at 1 PM in PACU  Follow-up in clinic in 1 week for Foley removal and discuss pathology results   Nickolas Madrid, MD

## 2020-12-30 NOTE — Anesthesia Preprocedure Evaluation (Addendum)
Anesthesia Evaluation  Patient identified by MRN, date of birth, ID band Patient awake    Reviewed: Allergy & Precautions, NPO status , Patient's Chart, lab work & pertinent test results  History of Anesthesia Complications Negative for: history of anesthetic complications  Airway Mallampati: III  TM Distance: >3 FB Neck ROM: Full    Dental  (+) Poor Dentition   Pulmonary asthma , COPD, former smoker,    breath sounds clear to auscultation- rhonchi (-) wheezing      Cardiovascular hypertension, Pt. on medications (-) CAD, (-) Past MI, (-) Cardiac Stents and (-) CABG negative cardio ROS   Rhythm:Regular Rate:Normal - Systolic murmurs and - Diastolic murmurs    Neuro/Psych neg Seizures negative neurological ROS  negative psych ROS   GI/Hepatic Neg liver ROS, GERD  ,  Endo/Other  negative endocrine ROSdiabetes, Type 2, Oral Hypoglycemic Agents  Renal/GU negative Renal ROS  negative genitourinary   Musculoskeletal negative musculoskeletal ROS (+)   Abdominal (+) + obese,   Peds negative pediatric ROS (+)  Hematology negative hematology ROS (+)   Anesthesia Other Findings Past Medical History: No date: Asthma No date: COPD (chronic obstructive pulmonary disease) (HCC) No date: Diabetes mellitus without complication (HCC)     Comment:  TYPE 2 No date: Hyperlipidemia No date: Hypertension 2014: MRSA cellulitis     Comment:  abdomen No date: Pneumonia   Reproductive/Obstetrics                            Anesthesia Physical Anesthesia Plan  ASA: 3  Anesthesia Plan: General   Post-op Pain Management:    Induction: Intravenous  PONV Risk Score and Plan: 2 and Ondansetron and Dexamethasone  Airway Management Planned: Oral ETT  Additional Equipment:   Intra-op Plan:   Post-operative Plan: Extubation in OR  Informed Consent: I have reviewed the patients History and Physical,  chart, labs and discussed the procedure including the risks, benefits and alternatives for the proposed anesthesia with the patient or authorized representative who has indicated his/her understanding and acceptance.     Dental advisory given  Plan Discussed with: CRNA and Anesthesiologist  Anesthesia Plan Comments:         Anesthesia Quick Evaluation

## 2021-01-02 ENCOUNTER — Telehealth: Payer: Self-pay

## 2021-01-02 LAB — SURGICAL PATHOLOGY

## 2021-01-02 NOTE — Telephone Encounter (Signed)
Pt LM on triage line stating that her catheter was painful and that she was leaking urine.   Called pt back she states that her catheter is painful. Upon further questioning pt states that she does not have a stat lock or a leg strap which is causing catheter to be taught with and on tension. Advised pt to come by the office to pick up stat lock or leg strap. Pt gave verbal understanding.

## 2021-01-06 ENCOUNTER — Telehealth: Payer: Self-pay

## 2021-01-06 NOTE — Telephone Encounter (Signed)
Pt lm on triage stating she would like to get her catheter removed earlier. I advised pt that we do not like to do foley removal on Friday as we do not want pt to end up in ER over the weekend. Pt verbalized understanding and will keep appointment as scheduled.

## 2021-01-10 ENCOUNTER — Ambulatory Visit (INDEPENDENT_AMBULATORY_CARE_PROVIDER_SITE_OTHER): Payer: 59 | Admitting: Urology

## 2021-01-10 ENCOUNTER — Encounter: Payer: Self-pay | Admitting: Urology

## 2021-01-10 ENCOUNTER — Other Ambulatory Visit: Payer: Self-pay

## 2021-01-10 VITALS — BP 170/82 | HR 74

## 2021-01-10 DIAGNOSIS — Z466 Encounter for fitting and adjustment of urinary device: Secondary | ICD-10-CM

## 2021-01-10 DIAGNOSIS — C672 Malignant neoplasm of lateral wall of bladder: Secondary | ICD-10-CM

## 2021-01-10 DIAGNOSIS — N3281 Overactive bladder: Secondary | ICD-10-CM | POA: Diagnosis not present

## 2021-01-10 MED ORDER — SULFAMETHOXAZOLE-TRIMETHOPRIM 800-160 MG PO TABS
2.0000 | ORAL_TABLET | Freq: Once | ORAL | Status: AC
  Administered 2021-01-10: 2 via ORAL

## 2021-01-10 NOTE — Progress Notes (Signed)
Catheter Removal  Patient is present today for a catheter removal. 51ml of water was drained from the balloon. A 20FR foley cath was removed from the bladder no complications were noted. Patient tolerated well.  Performed by: Gordy Clement, CMA   Follow up/ Additional notes: RTC as scheduled.

## 2021-01-10 NOTE — Progress Notes (Signed)
   01/10/2021 8:33 AM   Heather Nguyen 05/26/1960 [id number]  Reason for visit: Follow up TURBT results, OAB  HPI: 60 year old female who presented with gross hematuria and irritative urinary symptoms.  CT abdomen and pelvis with contrast was benign, however cytology showed significant bladder erythema suspicious for malignancy.  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  Postop gemcitabine was placed, and catheter was left for 1 week in setting of extensive resection.  We had a long discussion today about her new diagnosis of carcinoma in situ of the bladder.  We reviewed the AUA guidelines that recommend BCG therapy, and a total of 2 to 3 years duration of maintenance.  We discussed the need for close surveillance every 3 to 4 months with cystoscopy over the first 2 years, then start to space surveillance cystoscopy further.  We reviewed the risk of recurrence and progression.  Remove Foley today, Bactrim DS twice daily x2 Schedule induction BCG to start in 4 to 6 weeks, plan 2 to 3 years total maintenance First surveillance cystoscopy with me in 3 to 4 months Continue oxybutynin for irritative urinary symptoms/OAB   Billey Co, MD  [facility] [address] [place], [place] [address] [phone]

## 2021-01-10 NOTE — Patient Instructions (Signed)
Bladder Cancer Bladder cancer is a condition in which abnormal tissue (a tumor) grows in the bladder. The bladder is the organ that holds urine. Two tubes (ureters) carry the urine from the kidneys to the bladder. The bladder wall is made of layers of tissue. Cancer that spreads through these layers of the bladder wall becomes more difficult to treat. What are the causes? The cause of this condition is not known. What increases the risk? The following factors may make you more likely to develop this condition: Smoking. Working where there are risks (occupational exposures), such as working with Engineer, structural, Brewing technologist, clothing fabric, dyes, chemicals, and paint. Being 59 years of age or older. Being female. Having bladder inflammation that is long-term (chronic). Having a history of cancer, including: A family history of bladder cancer. Personal experience with bladder cancer. Having had certain treatments for cancer before. These include: Medicines to kill cancer cells (chemotherapy). Strong X-ray beams or capsules high in energy to kill cancer cells and shrink tumors (radiation therapy). Having been exposed to arsenic. This is a Financial risk analyst that can poison you. What are the signs or symptoms? Early symptoms of this condition include: Seeing blood in your urine. Feeling pain when urinating. Having infections of your urinary system (urinary tract infections or UTIs) that happen often. Having to urinate sooner or more often than usual. Later symptoms of this condition include: Not being able to urinate. Pain on one side of your lower back. Loss of appetite. Weight loss. Tiredness (fatigue). Swelling in your feet. Bone pain. How is this diagnosed? This condition is diagnosed based on: Your medical history. A physical exam. Lab tests, such as urine tests. Imaging tests. Your symptoms. You may also have other tests or procedures done, such as: A cystoscopy. A narrow tube is inserted  into your bladder through the organ that connects your bladder to the outside of your body (urethra). This is done to view the lining of your bladder for tumors. A biopsy. This procedure involves removing a tissue sample to look at it under a microscope to see if cancer is present. It is important to find out: How deeply into the bladder wall cancer has grown. Whether cancer has spread to any other parts of your body. This may require blood tests or imaging tests, such as a CT scan, MRI, bone scan, or X-rays. How is this treated? Your health care provider may recommend one or more types of treatment based on the stage of your cancer. The most common types of treatment are: Surgery to remove the cancer. Procedures that may be done include: Removing a tumor on the inside wall of the bladder (transurethral resection). Removing the bladder (cystectomy). Radiation therapy. This is often used together with chemotherapy. Chemotherapy. Immunotherapy. This uses medicines to help your immune system destroy cancer cells. Follow these instructions at home: Take over-the-counter and prescription medicines only as told by your health care provider. Eat a healthy diet. Some of your treatments might affect your appetite. Do not use any products that contain nicotine or tobacco, such as cigarettes, e-cigarettes, and chewing tobacco. If you need help quitting, ask your health care provider. Consider joining a support group. This may help you learn to cope with the stress of having bladder cancer. Tell your cancer care team if you develop side effects. Your team may be able to recommend ways to get relief. Keep all follow-up visits as told by your health care provider. This is important. Where to find more information American  Cancer Society: www.cancer.[place] www.cancer.gov Contact a health care provider if: You have symptoms of a urinary tract infection. These  include: Fever. Chills. Weakness. Muscle aches. Pain in your abdomen. Urge to urinate that is stronger and happens more often than usual. Burning feeling in the bladder or urethra when you urinate. Get help right away if: There is blood in your urine. You cannot urinate. You have severe pain or other symptoms that do not go away. Summary Bladder cancer is a condition in which tumors grow in the bladder and cause illness. This condition is diagnosed based on your medical history, a physical exam, lab tests, imaging tests, and your symptoms. Your health care provider may recommend one or more types of treatment based on the stage of your cancer. Consider joining a support group. This may help you learn to cope with the stress of having bladder cancer. This information is not intended to replace advice given to you by your health care provider. Make sure you discuss any questions you have with your health care provider. Document Revised: 10/29/2018 Document Reviewed: 10/29/2018 Elsevier Patient Education  2022 [place] Calmette-Guerin Live, BCG intravesical solution What is this medication? BACILLUS CALMETTE-GUERIN LIVE, BCG (ba SIL [place]  KAL met  gay RAYN) is a bacteria solution. This medicine stimulates the immune system to ward off cancer cells. It is used to treat bladder cancer. This medicine may be used for other purposes; ask your health care provider or pharmacist if you have questions. COMMON BRAND NAME(S): Theracys, TICE BCG What should I tell my care team before I take this medication? They need to know if you have any of these conditions: aneurysm blood in the urine bladder biopsy within 2 weeks fever or infection immune system problems leukemia lymphoma myasthenia gravis need organ transplant prosthetic device like arterial graft, artificial joint, prosthetic heart valve recent or ongoing radiation therapy tuberculosis an unusual or allergic reaction  to Bacillus Calmette-Guerin Live, BCG, latex, other medicines, foods, dyes, or preservatives pregnant or trying to get pregnant breast-feeding How should I use this medication? This drug is given as a catheter infusion into the bladder. It is administered in a hospital or clinic by a specially trained health care provider. You will be given directions to follow before the treatment. Follow your health care provider's directions carefully. This medicine contains live bacteria. It is very important to follow these directions closely after treatment to prevent others from coming in contact with your urine. Your health care provider may give you additional directions to follow. Try to hold this medicine in your bladder for 1 to 2 hours. Follow these directions the first time you go to the bathroom and for 6 hours after the first void. Wash your hands before using the restroom. After voiding, wash your hands and genital area. Use a toilet and sit when going to the bathroom. This helps to prevent the urine from splashing. Do not use public toilets or void outside. After each void, add 2 cups of undiluted bleach to the toilet. Close the lid. Wait 15 to 20 minutes and then flush the toilet. After the first void, drink more fluids to help dilute your urine. If you have urinary incontinence, wash the clothes you were wearing in the washer immediately. Do not wash other clothes at the same time. If you are wearing an incontinence pad, pour bleach on the pad and allow it to soak into the pad before throwing it away.  Put the pad in a plastic bag and put it in the trash. Talk to your pediatrician regarding the use of this medicine in children. Special care may be needed. Overdosage: If you think you have taken too much of this medicine contact a poison control center or emergency room at once. NOTE: This medicine is only for you. Do not share this medicine with others. What if I miss a dose? It is important not to  miss your dose. Call your doctor or health care professional if you are unable to keep an appointment. What may interact with this medication? antibiotics medicines to suppress your immune system like chemotherapy agents or corticosteroids medicine to treat tuberculosis This list may not describe all possible interactions. Give your health care provider a list of all the medicines, herbs, non-prescription drugs, or dietary supplements you use. Also tell them if you smoke, drink alcohol, or use illegal drugs. Some items may interact with your medicine. What should I watch for while using this medication? Visit your health care provider for checks on your progress. This medicine may make you feel generally unwell. Contact your health care provider if your symptoms last more than 2 days or if they get worse. Call your health care provider right away if you have a severe or unusual symptom. Infection can be spread to others through contact with this medicine. To prevent the spread of infection, follow your health care provider's directions carefully after treatment. Do not become pregnant while taking this medicine. There is a potential for serious side effects to an unborn child. Talk to your health care provider for more information. Do not breast-feed an infant while taking this medicine. If you have sex while on this medicine, use a condom. Ask your health care provider how long you should use a condom. What side effects may I notice from receiving this medication? Side effects that you should report to your doctor or health care professional as soon as possible: allergic reactions like skin rash, itching or hives, swelling of the face, lips, or tongue signs of infection - fever or chills, cough, sore throat, pain or difficulty passing urine signs of decreased red blood cells - unusually weak or tired, fainting spells, lightheadedness blood in urine breathing problems cough eye pain,  redness flu-like symptoms joint pain bladder-area pain for more than 2 days after treatment trouble passing urine or change in the amount of urine vomiting yellowing of the eyes or skin Side effects that usually do not require medical attention (report to your doctor or health care professional if they continue or are bothersome): bladder spasm burning when passing urine within 2 days of treatment feel need to pass urine often or wake up at night to pass urine loss of appetite This list may not describe all possible side effects. Call your doctor for medical advice about side effects. You may report side effects to FDA at 1-800-FDA-1088. Where should I keep my medication? This drug is given in a hospital or clinic and will not be stored at home. NOTE: This sheet is a summary. It may not cover all possible information. If you have questions about this medicine, talk to your doctor, pharmacist, or health care provider.  2022 Elsevier/Gold Standard (2018-04-25 00:00:00)

## 2021-01-18 ENCOUNTER — Other Ambulatory Visit: Payer: Self-pay | Admitting: Urology

## 2021-01-24 ENCOUNTER — Telehealth: Payer: Self-pay

## 2021-01-24 NOTE — Telephone Encounter (Signed)
Left patient a message to call the office to schedule treatments Mychart message also sent

## 2021-01-24 NOTE — Telephone Encounter (Signed)
Patient called back, BCG induction reviewed in detail and appointments scheduled. Patient was sent BCG education via my chart for review

## 2021-01-24 NOTE — Telephone Encounter (Signed)
-----   Message from Billey Co, MD sent at 01/10/2021  8:46 AM EST ----- Regarding: BCG patient Please schedule induction BCG x6 for her to start in 4 to 6 weeks, followed by cystoscopy with me 1 month after  Nickolas Madrid, MD 01/10/2021

## 2021-02-03 ENCOUNTER — Ambulatory Visit: Payer: 59 | Admitting: Physician Assistant

## 2021-02-09 ENCOUNTER — Ambulatory Visit: Payer: 59 | Admitting: Physician Assistant

## 2021-02-10 ENCOUNTER — Other Ambulatory Visit: Payer: Self-pay

## 2021-02-10 ENCOUNTER — Ambulatory Visit (INDEPENDENT_AMBULATORY_CARE_PROVIDER_SITE_OTHER): Payer: 59 | Admitting: Physician Assistant

## 2021-02-10 DIAGNOSIS — N3000 Acute cystitis without hematuria: Secondary | ICD-10-CM | POA: Diagnosis not present

## 2021-02-10 LAB — URINALYSIS, COMPLETE
Bilirubin, UA: NEGATIVE
Glucose, UA: NEGATIVE
Ketones, UA: NEGATIVE
Nitrite, UA: NEGATIVE
Protein,UA: NEGATIVE
Specific Gravity, UA: <1.005 — ABNORMAL LOW (ref 1.005–1.030)
Urobilinogen, Ur: 0.2 mg/dL (ref 0.2–1.0)
pH, UA: 5.5 (ref 5.0–7.5)

## 2021-02-10 LAB — MICROSCOPIC EXAMINATION: WBC, UA: >30 /hpf — ABNORMAL HIGH (ref 0–5)

## 2021-02-10 MED ORDER — SULFAMETHOXAZOLE-TRIMETHOPRIM 800-160 MG PO TABS: 1.0000 | ORAL_TABLET | Freq: Two times a day (BID) | ORAL | 0 refills | Status: AC

## 2021-02-10 NOTE — Progress Notes (Signed)
Patient presented to the clinic today for a scheduled BCG instillation.  UA today with pyuria, culture pending.  Patient reports 3 days of dysuria, lower abdominal pressure, and chills.  Will treat for acute UTI today.  Prescription for Bactrim DS BID x5 days sent to pharmacy.  Patient to return to clinic next week for next scheduled BCG treatment.  We will add an additional treatment to her scheduled series to make up for today's missed instillation.  Debroah Loop, PA-C 02/10/21 9:41 AM   I spent 10 minutes on the day of the encounter to include pre-visit record review, face-to-face time with the patient, and post-visit ordering of tests.

## 2021-02-12 IMAGING — CR DG CHEST 2V
2 series · 2 of 2 positions shown · non-contrast
Comparison: August 25, 2017

CLINICAL DATA: Cough and shortness of breath.  Sputum production.

EXAM:
CHEST - 2 VIEW

[chest pa]
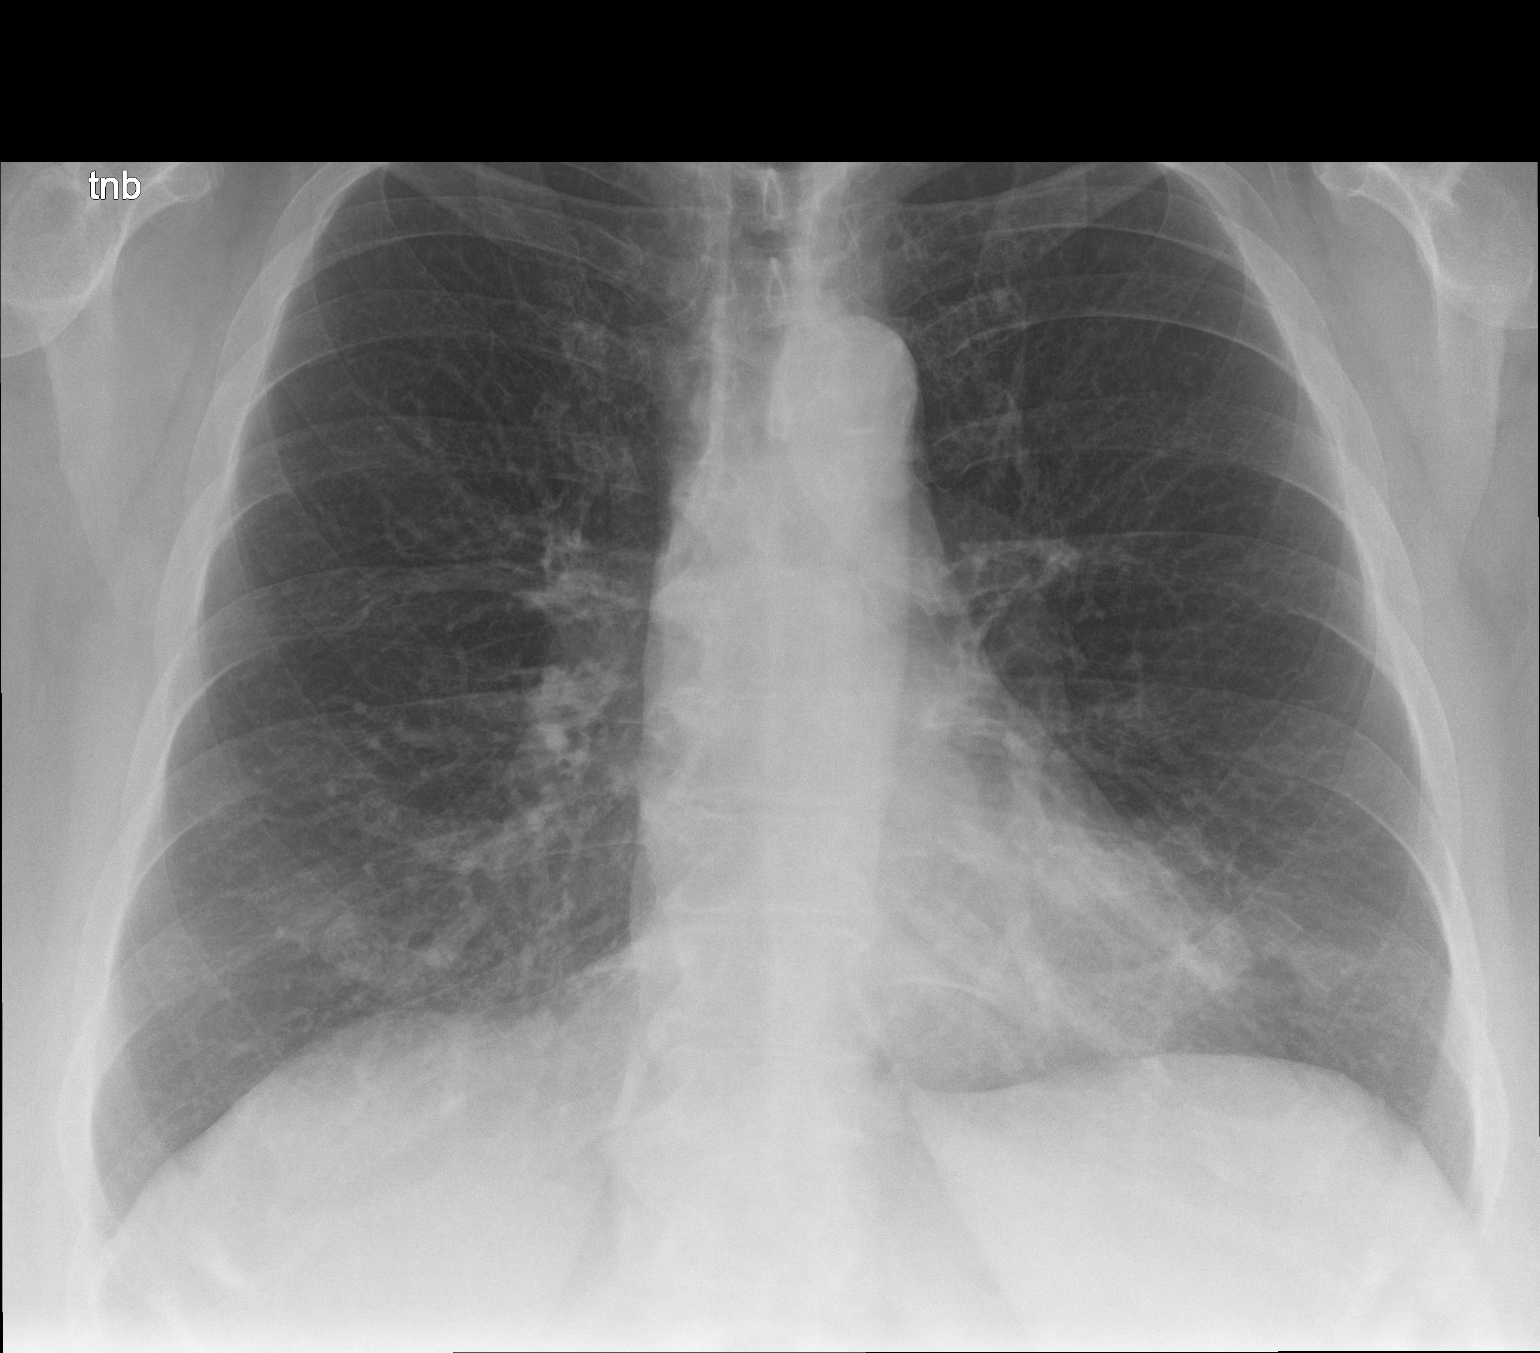

[chest lat]
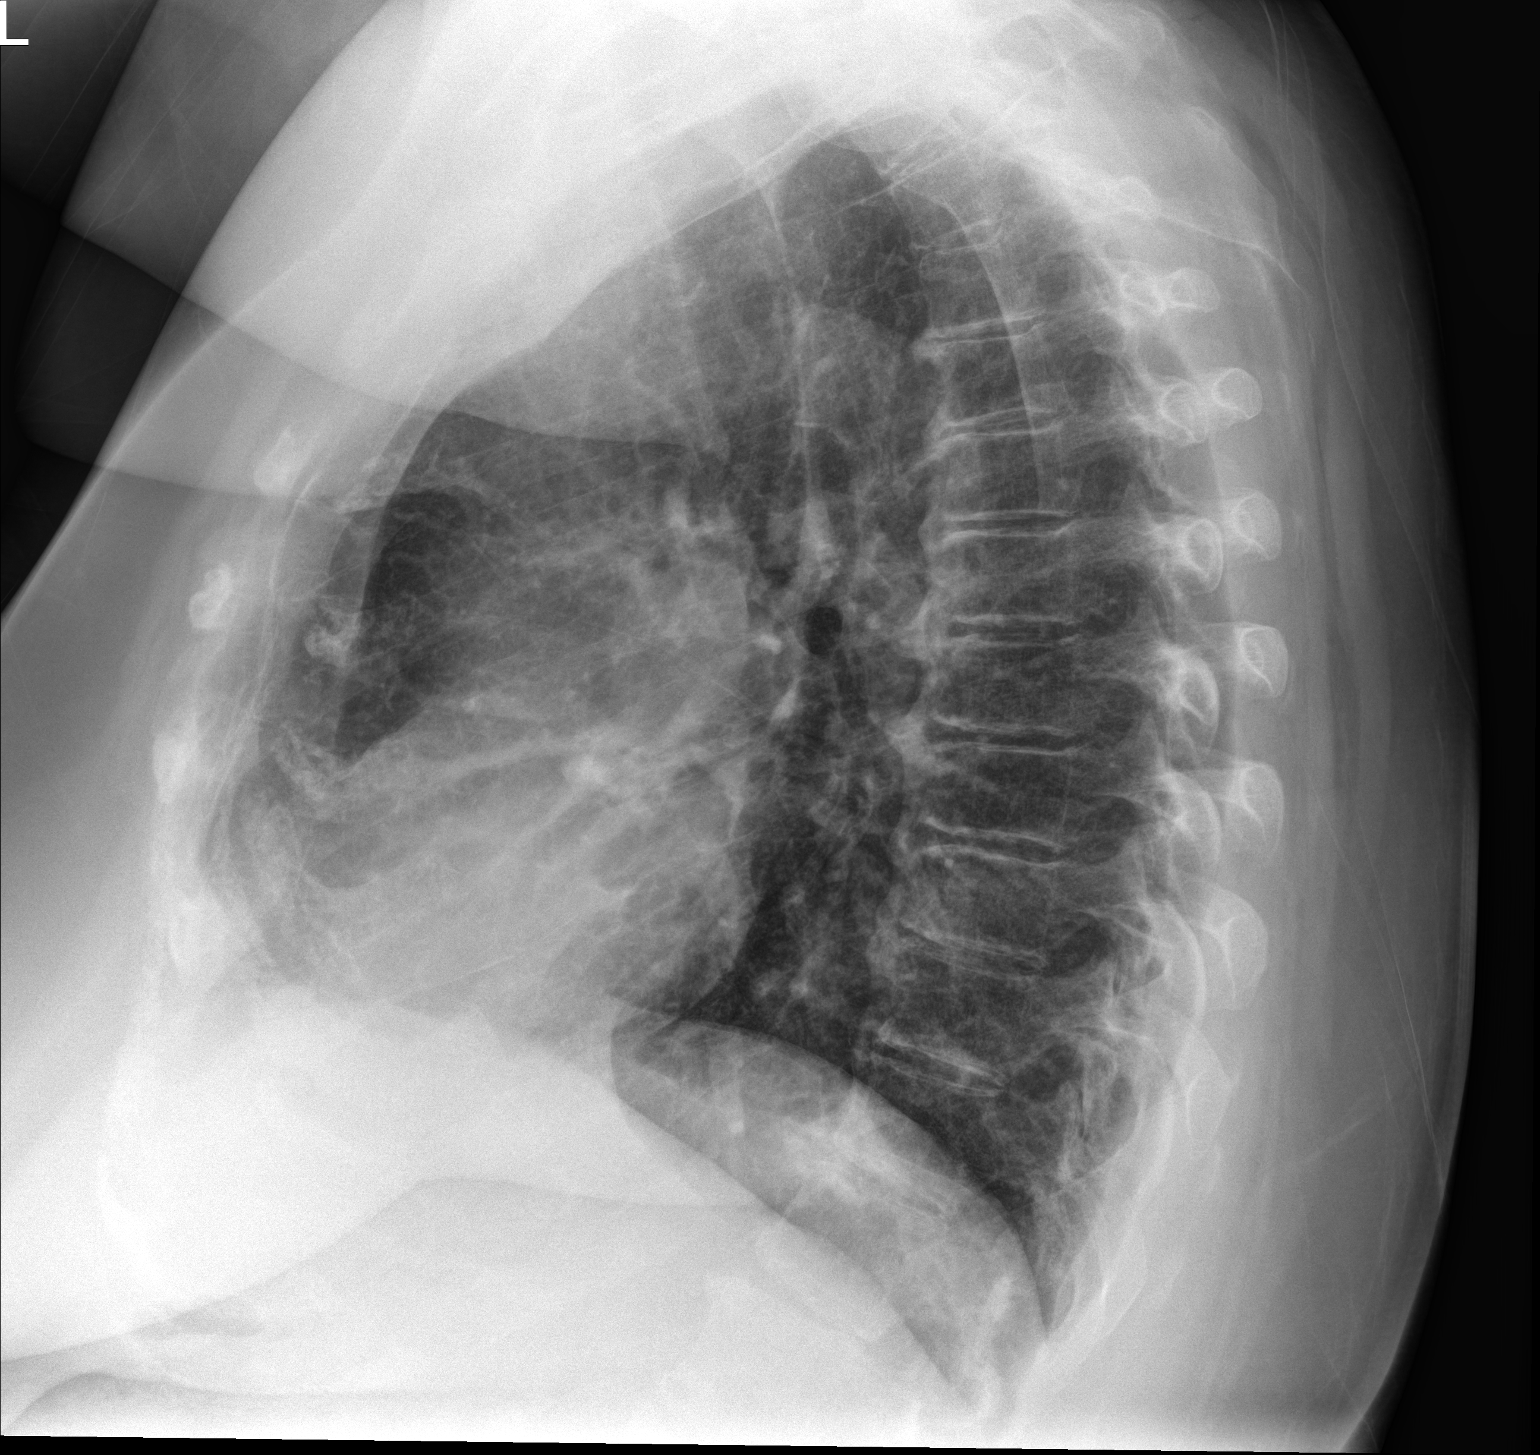

[2 of 2 positions shown; findings below may reference images not displayed]

FINDINGS: No pneumothorax. The right lung is clear. Mild opacity in left base
is similar in appearance to Tuesday August, 1998 [DATE] represent chronic
atelectasis or scarring. No acute infiltrates are identified.
IMPRESSION: Mild opacity in left base is similar since Thursday August, 2017, most
consistent with scarring or chronic atelectasis. No acute infiltrate
identified.

## 2021-02-14 LAB — CULTURE, URINE COMPREHENSIVE

## 2021-02-17 ENCOUNTER — Other Ambulatory Visit: Payer: Self-pay

## 2021-02-17 ENCOUNTER — Ambulatory Visit (INDEPENDENT_AMBULATORY_CARE_PROVIDER_SITE_OTHER): Payer: 59 | Admitting: Physician Assistant

## 2021-02-17 DIAGNOSIS — C672 Malignant neoplasm of lateral wall of bladder: Secondary | ICD-10-CM

## 2021-02-17 LAB — URINALYSIS, COMPLETE
Bilirubin, UA: NEGATIVE
Glucose, UA: NEGATIVE
Ketones, UA: NEGATIVE
Nitrite, UA: NEGATIVE
Protein,UA: NEGATIVE
Specific Gravity, UA: <1.03 (ref 1.005–1.030)
Urobilinogen, Ur: 0.2 mg/dL (ref 0.2–1.0)
pH, UA: 5.5 (ref 5.0–7.5)

## 2021-02-17 LAB — MICROSCOPIC EXAMINATION
Bacteria, UA: NONE SEEN
WBC, UA: >30 /hpf — ABNORMAL HIGH (ref 0–5)

## 2021-02-17 MED ORDER — BCG LIVE 50 MG IS SUSR
3.2400 mL | Freq: Once | INTRAVESICAL | Status: AC
  Administered 2021-02-17: 81 mg via INTRAVESICAL

## 2021-02-17 NOTE — Progress Notes (Signed)
BCG Bladder Instillation  BCG # 1 of 6  Due to Bladder Cancer patient is present today for a BCG treatment. Patient was cleaned and prepped in a sterile fashion with betadine. A 14FR catheter was inserted, urine return was noted 179ml, urine was yellow in color.  56ml of reconstituted BCG was instilled into the bladder. The catheter was then removed. Patient tolerated well, no complications were noted  Performed by: Debroah Loop, PA-C and Bradly Bienenstock, CMA  Additional notes: Patient remained in clinic for 30 minutes following instillation today for monitoring of hypersensitivity reaction; none noted.  We reviewed post-instillation instructions including holding the urine for 2 hours with quarter turns every 15 minutes and pouring bleach into the toilet with subsequent voids for 6 hours. Written instructions also provided today. She expressed understanding.   Follow up: 1 week for BCG #2 of 6

## 2021-02-17 NOTE — Patient Instructions (Signed)
Your Timeline for Today:  Right now through 11:35am: Hold your urine and do your quarter turns every 15 minutes. [id number] today: Every time you urinate, pour 1/2 cup of bleach into the toilet and let it sit for 15 minutes prior to flushing. 5:35pm onward: Resume your normal routine.   Patient Education: (BCG) Into the Bladder (Intravesical Chemotherapy)  BCG is a vaccine which is used to prevent tuberculosis (TB).  But it's also a helpful treatment for some early bladder cancers.  When BCG goes directly into the bladder the treatment is described as intravesical.  BCG is a type of immunotherapy.  Immunotherapy stimulates the body's immune system to destroy cancer cells.  How it's given BCG treatment is given to you in an outpatient setting.  It takes a few minutes to administer and you can go home as soon as it's finished.  It might be a good idea to ask someone to bring you, particularly the fist time.  Unlike chemotherapy into the bladder, BCG treatment is never given immediately after surgery to remove bladder tumors.  There needs to be a delay usually of at least two weeks after surgery, before you can have it.  You won't be given treatment with BCG if you are unwell or have an infection in your urine.  You're usually asked to limit the amount you drink before your treatment.  This will help to increase the concentration of BCG in your bladder.  Drinking too much before your treatment may make your bladder feel uncomfortably full.  If you normally take water tablets (diuretics) take them later in the day after your treatment.  Your nurse or doctor will give you more advise about preparing for your treatment.  You will have a small tube (catheter) placed into your bladder.  Your doctor will then put the liquid vaccine directly into your bladder through the catheter and remove the catheter.  You will need to hold your urine for two hours afterwards.  This can be difficult but it's to give  the treatment time to work.  You can walk around during this time.  When the treatment is over you can go to the toilet.  After your treatment there are some precautions you'll need to take.  This is because BCG is a live vaccine and other people shouldn't be exposed to it.  For the next six hours, you'll need to avoid your urine splashing on the toilet seat and getting any urine on your hands.  It might be easer for men to sit down when they're using an ordinary toilet although using a stand up urinal should be alright.  The main this is to avoid splashing urine and spreading the vaccine.  You will also be asked to put 1/2 cup undiluted bleach into the toilet to destroy any live vaccine and leave it for 15 minutes until you flush.  Side Effects Because BCG goes directly into the bladder most of the side effects are linked with the bladder.  They usually go away within one to two days after your treatment.  The most common ones are: -needing to pass urine often -pain when you pass urine -blood in urine -flu-like symptoms (tiredness, general aching and raised temperature)  Theses side effects should settle down within a day or two.  If they don't get better contact your doctor.  Drinking lots of fluids can help flush the drug out of your bladder and reduce some of these effects.  Taking Ibuprofen or Aleve is  encouraged unless you have a condition that would make these medications unsafe to take (renal failure, diabetes, gerd)  Rare side effects can include a continuing high temperature (fever), pain in your joints and a cough.  If you have any of these symptoms, or if you feel generally unwell, contact your doctor.  These symptoms could be a sign of a more serious infection (due to BCG) that needs to be treated immediately.  If this happens you'll be treated with the same drugs (antibiotics) that are used to treat TB.  Contraception Men should use a condom during sex for the first 48 hours after  their treatment.  If you are a women who has had BCG treatment then your partner should use a condom.  Using a condom will protect your partner from any vaccine present in your semen or vaginal fluid.  We don't know how BCG may affect a developing fetus so it's not advisable to become pregnant or father a child while having it.  It is important to use effective contraception during your treatment and for six weeks afterwards.  You can discuss this with your doctor or specialist nurse.

## 2021-02-23 ENCOUNTER — Ambulatory Visit (INDEPENDENT_AMBULATORY_CARE_PROVIDER_SITE_OTHER): Payer: 59 | Admitting: Physician Assistant

## 2021-02-23 ENCOUNTER — Other Ambulatory Visit: Payer: Self-pay

## 2021-02-23 DIAGNOSIS — C672 Malignant neoplasm of lateral wall of bladder: Secondary | ICD-10-CM | POA: Diagnosis not present

## 2021-02-23 MED ORDER — BCG LIVE 50 MG IS SUSR: 3.2400 mL | Freq: Once | INTRAVESICAL | Status: DC

## 2021-02-23 MED ORDER — BCG LIVE 50 MG IS SUSR
3.2400 mL | Freq: Once | INTRAVESICAL | Status: AC
  Administered 2021-02-23: 81 mg via INTRAVESICAL

## 2021-02-23 NOTE — Progress Notes (Signed)
BCG Bladder Instillation  BCG # 2 of 6  Due to Bladder Cancer patient is present today for a BCG treatment. Patient was cleaned and prepped in a sterile fashion with betadine. A 14FR catheter was inserted, urine return was noted 130ml, urine was yellow in color.  5ml of reconstituted BCG was instilled into the bladder. The catheter was then removed. Patient tolerated well, no complications were noted  Performed by: Debroah Loop, PA-C and Bradly Bienenstock, CMA  Follow up/ Additional notes: 1 week for BCG #3 of 6

## 2021-02-24 LAB — MICROSCOPIC EXAMINATION

## 2021-02-24 LAB — URINALYSIS, COMPLETE
Bilirubin, UA: NEGATIVE
Glucose, UA: NEGATIVE
Ketones, UA: NEGATIVE
Nitrite, UA: NEGATIVE
Protein,UA: NEGATIVE
Specific Gravity, UA: 1.01 (ref 1.005–1.030)
Urobilinogen, Ur: 0.2 mg/dL (ref 0.2–1.0)
pH, UA: 5 (ref 5.0–7.5)

## 2021-03-03 ENCOUNTER — Ambulatory Visit (INDEPENDENT_AMBULATORY_CARE_PROVIDER_SITE_OTHER): Payer: 59 | Admitting: Physician Assistant

## 2021-03-03 ENCOUNTER — Other Ambulatory Visit: Payer: Self-pay

## 2021-03-03 DIAGNOSIS — C672 Malignant neoplasm of lateral wall of bladder: Secondary | ICD-10-CM | POA: Diagnosis not present

## 2021-03-03 LAB — URINALYSIS, COMPLETE
Bilirubin, UA: NEGATIVE
Glucose, UA: NEGATIVE
Ketones, UA: NEGATIVE
Nitrite, UA: NEGATIVE
Protein,UA: NEGATIVE
Specific Gravity, UA: 1.01 (ref 1.005–1.030)
Urobilinogen, Ur: 0.2 mg/dL (ref 0.2–1.0)
pH, UA: 5.5 (ref 5.0–7.5)

## 2021-03-03 LAB — MICROSCOPIC EXAMINATION: Epithelial Cells (non renal): >10 /hpf — AB (ref 0–10)

## 2021-03-03 MED ORDER — BCG LIVE 50 MG IS SUSR
3.2400 mL | Freq: Once | INTRAVESICAL | Status: AC
  Administered 2021-03-03: 81 mg via INTRAVESICAL

## 2021-03-03 NOTE — Progress Notes (Signed)
BCG Bladder Instillation  BCG # 3 of 6  Due to Bladder Cancer patient is present today for a BCG treatment. Patient was cleaned and prepped in a sterile fashion with betadine. A 14FR catheter was inserted, urine return was noted 54ml, urine was yellow in color.  76ml of reconstituted BCG was instilled into the bladder. The catheter was then removed. Patient tolerated well, no complications were noted  Performed by: Debroah Loop, PA-C and Bradly Bienenstock, CMA  Follow up/ Additional notes: 1 week for BCG #4 of 6

## 2021-03-09 ENCOUNTER — Ambulatory Visit (INDEPENDENT_AMBULATORY_CARE_PROVIDER_SITE_OTHER): Payer: 59 | Admitting: *Deleted

## 2021-03-09 ENCOUNTER — Other Ambulatory Visit: Payer: Self-pay

## 2021-03-09 DIAGNOSIS — C672 Malignant neoplasm of lateral wall of bladder: Secondary | ICD-10-CM

## 2021-03-09 MED ORDER — BCG LIVE 50 MG IS SUSR
1.0800 mL | Freq: Once | INTRAVESICAL | Status: AC
  Administered 2021-03-09: 27 mg via INTRAVESICAL

## 2021-03-09 NOTE — Progress Notes (Signed)
BCG Bladder Instillation  BCG # 4 of 6  Due to Bladder Cancer patient is present today for a BCG treatment. Patient was cleaned and prepped in a sterile fashion with betadine. A 14FR catheter was inserted, urine return was noted 176ml, urine was yellow in color.  29ml of reconstituted BCG was instilled into the bladder. The catheter was then removed. Patient tolerated well, no complications were noted  Performed by: Verlene Mayer, CMA & Kerman Passey, RMA  Follow up/ Additional notes: Next week

## 2021-03-10 ENCOUNTER — Ambulatory Visit: Payer: 59 | Admitting: Physician Assistant

## 2021-03-10 LAB — URINALYSIS, COMPLETE
Bilirubin, UA: NEGATIVE
Glucose, UA: NEGATIVE
Ketones, UA: NEGATIVE
Nitrite, UA: NEGATIVE
Protein,UA: NEGATIVE
RBC, UA: NEGATIVE
Specific Gravity, UA: 1.01 (ref 1.005–1.030)
Urobilinogen, Ur: 0.2 mg/dL (ref 0.2–1.0)
pH, UA: 6 (ref 5.0–7.5)

## 2021-03-10 LAB — MICROSCOPIC EXAMINATION

## 2021-03-16 ENCOUNTER — Other Ambulatory Visit: Payer: Self-pay

## 2021-03-16 ENCOUNTER — Ambulatory Visit (INDEPENDENT_AMBULATORY_CARE_PROVIDER_SITE_OTHER): Payer: 59

## 2021-03-16 DIAGNOSIS — C672 Malignant neoplasm of lateral wall of bladder: Secondary | ICD-10-CM

## 2021-03-16 MED ORDER — BCG LIVE 50 MG IS SUSR
1.0800 mL | Freq: Once | INTRAVESICAL | Status: AC
  Administered 2021-03-16: 27 mg via INTRAVESICAL

## 2021-03-16 NOTE — Progress Notes (Signed)
BCG Bladder Instillation  BCG # 5 of 6   Due to Bladder Cancer patient is present today for a BCG treatment. Patient was cleaned and prepped in a sterile fashion with betadine. A 16FR catheter was inserted, urine return was noted 200 ml, urine was yellow  in color.  65ml of reconstituted BCG was instilled into the bladder. The catheter was then removed. Patient tolerated well, no complications were noted  Performed by: Kerman Passey RMA & Verlene Mayer CMA  Follow up/ Additional notes: 1 week

## 2021-03-17 LAB — URINALYSIS, COMPLETE
Bilirubin, UA: NEGATIVE
Glucose, UA: NEGATIVE
Ketones, UA: NEGATIVE
Nitrite, UA: NEGATIVE
Protein,UA: NEGATIVE
Specific Gravity, UA: <1.005 — ABNORMAL LOW (ref 1.005–1.030)
Urobilinogen, Ur: 0.2 mg/dL (ref 0.2–1.0)
pH, UA: 6 (ref 5.0–7.5)

## 2021-03-17 LAB — MICROSCOPIC EXAMINATION: Epithelial Cells (non renal): >10 /hpf — AB (ref 0–10)

## 2021-03-21 LAB — CULTURE, URINE COMPREHENSIVE

## 2021-03-23 ENCOUNTER — Other Ambulatory Visit: Payer: Self-pay

## 2021-03-23 ENCOUNTER — Ambulatory Visit: Payer: 59 | Admitting: *Deleted

## 2021-03-23 DIAGNOSIS — C672 Malignant neoplasm of lateral wall of bladder: Secondary | ICD-10-CM

## 2021-03-23 LAB — URINALYSIS, COMPLETE
Bilirubin, UA: NEGATIVE
Glucose, UA: NEGATIVE
Ketones, UA: NEGATIVE
Nitrite, UA: NEGATIVE
Protein,UA: NEGATIVE
Specific Gravity, UA: <1.005 — ABNORMAL LOW (ref 1.005–1.030)
Urobilinogen, Ur: 0.2 mg/dL (ref 0.2–1.0)
pH, UA: 5.5 (ref 5.0–7.5)

## 2021-03-23 LAB — MICROSCOPIC EXAMINATION

## 2021-03-23 MED ORDER — BCG LIVE 50 MG IS SUSR
1.0800 mL | Freq: Once | INTRAVESICAL | Status: AC
  Administered 2021-03-23: 27 mg via INTRAVESICAL

## 2021-03-23 NOTE — Progress Notes (Signed)
BCG Bladder Instillation  BCG # 6 of 6  Due to Bladder Cancer patient is present today for a BCG treatment. Patient was cleaned and prepped in a sterile fashion with betadine. A 16 FR catheter was inserted, urine return was noted 50 ml, urine was yellow  in color.  5ml of reconstituted BCG was instilled into the bladder. The catheter was then removed. Patient tolerated well, no complications were noted  Performed by: Kerman Passey , RMA & Verlene Mayer CMA  Follow up/ Additional notes: As scheduled

## 2021-04-20 ENCOUNTER — Ambulatory Visit
Admission: RE | Admit: 2021-04-20 | Discharge: 2021-04-20 | Disposition: A | Discharge status: Home / Self Care | Payer: 59 | Source: Ambulatory Visit | Attending: Chiropractic Medicine | Admitting: Chiropractic Medicine

## 2021-04-20 ENCOUNTER — Other Ambulatory Visit: Payer: Self-pay

## 2021-04-20 ENCOUNTER — Other Ambulatory Visit: Payer: Self-pay | Admitting: Chiropractic Medicine

## 2021-04-20 ENCOUNTER — Ambulatory Visit
Admission: RE | Admit: 2021-04-20 | Discharge: 2021-04-20 | Disposition: A | Discharge status: Home / Self Care | Payer: 59 | Attending: Chiropractic Medicine | Admitting: Chiropractic Medicine

## 2021-04-20 DIAGNOSIS — M542 Cervicalgia: Secondary | ICD-10-CM | POA: Insufficient documentation

## 2021-04-20 DIAGNOSIS — M9901 Segmental and somatic dysfunction of cervical region: Secondary | ICD-10-CM | POA: Insufficient documentation

## 2021-04-25 ENCOUNTER — Other Ambulatory Visit: Payer: 59 | Admitting: Urology

## 2021-05-02 ENCOUNTER — Other Ambulatory Visit: Payer: Self-pay | Admitting: *Deleted

## 2021-05-02 ENCOUNTER — Encounter: Payer: Self-pay | Admitting: Urology

## 2021-05-02 ENCOUNTER — Other Ambulatory Visit: Payer: Self-pay

## 2021-05-02 ENCOUNTER — Other Ambulatory Visit
Admission: RE | Admit: 2021-05-02 | Discharge: 2021-05-02 | Disposition: A | Discharge status: Home / Self Care | Payer: 59 | Attending: Urology | Admitting: Urology

## 2021-05-02 ENCOUNTER — Ambulatory Visit: Payer: 59 | Admitting: Urology

## 2021-05-02 ENCOUNTER — Other Ambulatory Visit: Payer: Self-pay | Admitting: Urology

## 2021-05-02 VITALS — BP 153/69 | HR 71 | Ht 61.0 in | Wt 241.2 lb

## 2021-05-02 DIAGNOSIS — C672 Malignant neoplasm of lateral wall of bladder: Secondary | ICD-10-CM | POA: Insufficient documentation

## 2021-05-02 DIAGNOSIS — Z8551 Personal history of malignant neoplasm of bladder: Secondary | ICD-10-CM | POA: Diagnosis not present

## 2021-05-02 LAB — URINALYSIS, COMPLETE (UACMP) WITH MICROSCOPIC
Bilirubin Urine: NEGATIVE
Glucose, UA: NEGATIVE mg/dL
Hgb urine dipstick: NEGATIVE
Ketones, ur: NEGATIVE mg/dL
Leukocytes,Ua: NEGATIVE
Nitrite: NEGATIVE
Protein, ur: NEGATIVE mg/dL
Specific Gravity, Urine: <1.005 — ABNORMAL LOW (ref 1.005–1.030)
pH: 5.5 (ref 5.0–8.0)

## 2021-05-02 MED ORDER — LIDOCAINE HCL URETHRAL/MUCOSAL 2 % EX GEL
1.0000 "application " | Freq: Once | CUTANEOUS | Status: AC
  Administered 2021-05-02: 1 via URETHRAL

## 2021-05-02 NOTE — Progress Notes (Signed)
Cystoscopy Procedure Note:  Indication: History of bladder cancer  61 year old female who presented with irritative urinary symptoms, normal CT urogram, and cystoscopy showed erythematous bladder lesions.  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  Induction BCG completed January 2023  Her irritative and overactive urinary symptoms have resolved after TURBT, and she is no longer on oxybutynin.  After informed consent and discussion of the procedure and its risks, Heather Nguyen was positioned and prepped in the standard fashion. Cystoscopy was performed with a flexible cystoscope. The urethra, bladder neck and entire bladder was visualized in a standard fashion. The ureteral orifices were visualized in their normal location and orientation.  Subtle erythematous changes at the posterior wall, but no definite tumor recurrence.  Directed cytology sent.  Findings: Subtle erythema at posterior bladder wall  Assessment and Plan: Follow-up cytology-> if suspicious or positive pursue bladder biopsy and fulguration If cytology benign continue maintenance BCG, due April 2023  Nickolas Madrid, MD 05/02/2021

## 2021-05-04 LAB — CYTOLOGY - NON PAP

## 2021-05-05 ENCOUNTER — Other Ambulatory Visit: Payer: Self-pay | Admitting: Urology

## 2021-05-05 DIAGNOSIS — N329 Bladder disorder, unspecified: Secondary | ICD-10-CM

## 2021-05-05 NOTE — Progress Notes (Signed)
Surgical Physician Order Form [facility] [place] ? ?* Scheduling expectation : Next Available ? ?*Length of Case: 30 minutes ? ?*Clearance needed: no ? ?*Anticoagulation Instructions: Hold all anticoagulants ? ?*Aspirin Instructions: Hold Aspirin ? ?*Post-op visit Date/Instructions: TBD ? ?*Diagnosis: Bladder Lesion ? ?*Procedure: Cysto Bladder Biopsy (00511) ? ? ?Additional orders: Gemcitabine 2000mg  bladder instillation ? ?-Admit type: OUTpatient ? ?-Anesthesia: General ? ?-VTE Prophylaxis Standing Order SCD?s    ?   ?Other:  ? ?-Standing Lab Orders Per Anesthesia   ? ?Lab other: UA&Urine Culture ? ?-Standing Test orders EKG/Chest x-ray per Anesthesia      ? ?Test other:  ? ?- Medications:  Ancef 2gm IV ? ?-Other orders:  N/A ? ? ? ?  ? ?

## 2021-05-08 ENCOUNTER — Telehealth: Payer: Self-pay

## 2021-05-08 NOTE — Telephone Encounter (Signed)
Called pt informed her of the information below. Pt voiced understanding.  

## 2021-05-08 NOTE — Telephone Encounter (Signed)
-----   Message from Billey Co, MD sent at 05/05/2021 12:57 PM EST ----- ?Urine sample suspicious for possible recurrence of bladder cancer.  We need to schedule bladder biopsy and fulguration as we discussed in clinic, Melissa will call to schedule, and orders are in ? ?Nickolas Madrid, MD ?05/05/2021 ? ? ?

## 2021-05-09 ENCOUNTER — Telehealth: Payer: Self-pay

## 2021-05-09 NOTE — Telephone Encounter (Signed)
I spoke with Conita. We have discussed possible surgery dates and Friday March 31st, 2023 was agreed upon by all parties. Patient given information about surgery date, what to expect pre-operatively and post operatively.  ? ?We discussed that a Pre-Admission Testing office will be calling to set up the pre-op visit that will take place prior to surgery, and that these appointments are typically done over the phone with a Pre-Admissions RN.  ? ?Informed patient that our office will communicate any additional care to be provided after surgery. Patients questions or concerns were discussed during our call. Advised to call our office should there be any additional information, questions or concerns that arise. Patient verbalized understanding.  ? ?

## 2021-05-09 NOTE — Progress Notes (Signed)
[facility] Posting Form   Surgery Date/Time: Date: 06/02/2021  Surgeon: Dr. Nickolas Madrid, MD  Surgery Location: Day Surgery  Inpt ( No  )   Outpt (Yes)   Obs ( No  )   Diagnosis: N32.9 Bladder Lesion  -CPT: 16109, 60454  Surgery: Cystoscopy with Bladder Biopsy and instillation of intravesical Gemcitabine  Stop Anticoagulations: Yes, also hold ASA  Cardiac/Medical/Pulmonary Clearance needed: no  *Orders entered into EPIC  Date: 05/09/21   *Case booked in [place]  Date: 05/08/2021  *Notified pt of Surgery: Date: 05/08/2021  PRE-OP UA & CX: yes, will obtain at [facility] into Prior Authorization Work Que Date: 05/09/21   Assistant/laser/rep:No

## 2021-05-23 ENCOUNTER — Other Ambulatory Visit: Payer: Self-pay

## 2021-05-23 ENCOUNTER — Encounter
Admission: RE | Admit: 2021-05-23 | Discharge: 2021-05-23 | Disposition: A | Discharge status: Home / Self Care | Payer: 59 | Source: Ambulatory Visit | Attending: Urology | Admitting: Urology

## 2021-05-23 ENCOUNTER — Other Ambulatory Visit
Admission: RE | Admit: 2021-05-23 | Discharge: 2021-05-23 | Disposition: A | Discharge status: Home / Self Care | Payer: 59 | Attending: Urology | Admitting: Urology

## 2021-05-23 VITALS — Ht 62.0 in | Wt 230.0 lb

## 2021-05-23 DIAGNOSIS — I10 Essential (primary) hypertension: Secondary | ICD-10-CM

## 2021-05-23 DIAGNOSIS — Z01818 Encounter for other preprocedural examination: Secondary | ICD-10-CM

## 2021-05-23 DIAGNOSIS — J441 Chronic obstructive pulmonary disease with (acute) exacerbation: Secondary | ICD-10-CM

## 2021-05-23 DIAGNOSIS — N329 Bladder disorder, unspecified: Secondary | ICD-10-CM | POA: Insufficient documentation

## 2021-05-23 HISTORY — DX: Gastro-esophageal reflux disease without esophagitis: K21.9

## 2021-05-23 HISTORY — DX: Malignant (primary) neoplasm, unspecified: C80.1

## 2021-05-23 LAB — URINALYSIS, COMPLETE (UACMP) WITH MICROSCOPIC
Glucose, UA: NEGATIVE mg/dL
Ketones, ur: NEGATIVE mg/dL
Nitrite: NEGATIVE
Specific Gravity, Urine: 1.025 (ref 1.005–1.030)
pH: 5.5 (ref 5.0–8.0)

## 2021-05-23 NOTE — Patient Instructions (Addendum)
Your procedure is scheduled on: Friday 06/02/21 ?Report to the Registration Desk on the 1st floor of the [facility]. ?To find out your arrival time, please call [phone] between 1PM - 3PM on: Thursday 06/01/21 ? ?REMEMBER: ?Instructions that are not followed completely may result in serious medical risk, up to and including death; or upon the discretion of your surgeon and anesthesiologist your surgery may need to be rescheduled. ? ?Do not eat or drink after midnight the night before surgery.  ?No gum chewing, lozengers or hard candies. ? ?TAKE THESE MEDICATIONS THE MORNING OF SURGERY WITH A SIP OF WATER: ?omeprazole (PRILOSEC) 40 MG capsule ? ?omeprazole (PRILOSEC) 40 MG capsule (take one the night before and one on the morning of surgery - helps to prevent nausea after surgery.) ? ?Use albuterol (PROVENTIL HFA;VENTOLIN HFA) 108 (90 Base) MCG/ACT inhaler on the day of surgery and bring to the hospital. ? ?Hold your metFORMIN (GLUCOPHAGE-XR) 500 MG 24 hr tablet for 2 days prior to surgery ? ?One week prior to surgery: ?Stop Anti-inflammatories (NSAIDS) such as Advil, Aleve, Ibuprofen, Motrin, Naproxen, Naprosyn and Aspirin based products such as Excedrin, Goodys Powder, BC Powder. ?Stop ANY OVER THE COUNTER supplements until after surgery. ?You may however, continue to take Tylenol if needed for pain up until the day of surgery. ? ?No Alcohol for 24 hours before or after surgery. ? ?No Smoking including e-cigarettes for 24 hours prior to surgery.  ?No chewable tobacco products for at least 6 hours prior to surgery.  ?No nicotine patches on the day of surgery. ? ?Do not use any "recreational" drugs for at least a week prior to your surgery.  ?Please be advised that the combination of cocaine and anesthesia may have negative outcomes, up to and including death. ?If you test positive for cocaine, your surgery will be cancelled. ? ?On the morning of surgery brush your teeth with toothpaste and water, you may rinse  your mouth with mouthwash if you wish. ?Do not swallow any toothpaste or mouthwash. ? ?Do not wear jewelry, make-up, hairpins, clips or nail polish. ? ?Do not wear lotions, powders, or perfumes.  ? ?Do not shave body from the neck down 48 hours prior to surgery just in case you cut yourself which could leave a site for infection.  ?Also, freshly shaved skin may become irritated if using the CHG soap. ? ?Do not bring valuables to the hospital. [facility] is not responsible for any missing/lost belongings or valuables.  ? ?Notify your doctor if there is any change in your medical condition (cold, fever, infection). ? ?Wear comfortable clothing (specific to your surgery type) to the hospital. ? ?If you are being discharged the day of surgery, you will not be allowed to drive home. ?You will need a responsible adult (18 years or older) to drive you home and stay with you that night.  ? ?If you are taking public transportation, you will need to have a responsible adult (18 years or older) with you. ?Please confirm with your physician that it is acceptable to use public transportation.  ? ?Please call the [facility] Dept. at [phone] if you have any questions about these instructions. ? ?Surgery Visitation Policy: ? ?Patients undergoing a surgery or procedure may have two family members or support persons with them as long as the person is not COVID-19 positive or experiencing its symptoms.  ? ?Inpatient Visitation:   ? ?Visiting hours are 7 a.m. to 8 p.m. ?Up to four visitors  are allowed at one time in a patient room, including children. The visitors may rotate out with other people during the day. One designated support person (adult) may remain overnight. ? ?All Areas: ?All visitors must pass COVID-19 screenings, use hand sanitizer when entering and exiting the patient?s room and wear a mask at all times, including in the patient?s room. ?Patients must also wear a mask when staff or their visitor are  in the room. ?Masking is required regardless of vaccination status.  ?

## 2021-05-25 LAB — URINE CULTURE

## 2021-05-29 ENCOUNTER — Other Ambulatory Visit: Payer: Self-pay

## 2021-05-29 ENCOUNTER — Encounter: Payer: Self-pay | Admitting: Urgent Care

## 2021-05-29 ENCOUNTER — Encounter
Admission: RE | Admit: 2021-05-29 | Discharge: 2021-05-29 | Disposition: A | Discharge status: Home / Self Care | Payer: 59 | Source: Ambulatory Visit | Attending: Urology | Admitting: Urology

## 2021-05-29 DIAGNOSIS — I10 Essential (primary) hypertension: Secondary | ICD-10-CM | POA: Diagnosis not present

## 2021-05-29 DIAGNOSIS — J441 Chronic obstructive pulmonary disease with (acute) exacerbation: Secondary | ICD-10-CM | POA: Diagnosis not present

## 2021-05-29 DIAGNOSIS — Z01818 Encounter for other preprocedural examination: Secondary | ICD-10-CM | POA: Diagnosis not present

## 2021-05-29 LAB — CBC
HCT: 43.5 % (ref 36.0–46.0)
Hemoglobin: 14.1 g/dL (ref 12.0–15.0)
MCH: 27.6 pg (ref 26.0–34.0)
MCHC: 32.4 g/dL (ref 30.0–36.0)
MCV: 85.3 fL (ref 80.0–100.0)
Platelets: 235 10*3/uL (ref 150–400)
RBC: 5.1 MIL/uL (ref 3.87–5.11)
RDW: 13.3 % (ref 11.5–15.5)
WBC: 8.2 10*3/uL (ref 4.0–10.5)
nRBC: 0 % (ref 0.0–0.2)

## 2021-05-29 LAB — BASIC METABOLIC PANEL
Anion gap: 11 (ref 5–15)
BUN: 20 mg/dL (ref 8–23)
CO2: 25 mmol/L (ref 22–32)
Calcium: 9.5 mg/dL (ref 8.9–10.3)
Chloride: 105 mmol/L (ref 98–111)
Creatinine, Ser: 0.97 mg/dL (ref 0.44–1.00)
GFR, Estimated: >60 mL/min (ref >60)
Glucose, Bld: 107 mg/dL — ABNORMAL HIGH (ref 70–99)
Potassium: 4 mmol/L (ref 3.5–5.1)
Sodium: 141 mmol/L (ref 135–145)

## 2021-06-01 NOTE — Addendum Note (Signed)
Addended by: Gerald Leitz A on: 06/01/2021 11:24 AM ? ? Modules accepted: Orders ? ?

## 2021-06-27 ENCOUNTER — Other Ambulatory Visit: Payer: Self-pay

## 2021-06-27 ENCOUNTER — Encounter
Admission: RE | Admit: 2021-06-27 | Discharge: 2021-06-27 | Disposition: A | Discharge status: Home / Self Care | Payer: 59 | Source: Ambulatory Visit | Attending: Urology | Admitting: Urology

## 2021-06-27 ENCOUNTER — Other Ambulatory Visit
Admission: RE | Admit: 2021-06-27 | Discharge: 2021-06-27 | Disposition: A | Discharge status: Home / Self Care | Payer: 59 | Attending: Urology | Admitting: Urology

## 2021-06-27 DIAGNOSIS — N329 Bladder disorder, unspecified: Secondary | ICD-10-CM | POA: Insufficient documentation

## 2021-06-27 LAB — URINALYSIS, COMPLETE (UACMP) WITH MICROSCOPIC
Bilirubin Urine: NEGATIVE
Glucose, UA: NEGATIVE mg/dL
Hgb urine dipstick: NEGATIVE
Ketones, ur: NEGATIVE mg/dL
Nitrite: NEGATIVE
Protein, ur: NEGATIVE mg/dL
Specific Gravity, Urine: <1.005 — ABNORMAL LOW (ref 1.005–1.030)
pH: 5.5 (ref 5.0–8.0)

## 2021-06-27 NOTE — Patient Instructions (Addendum)
Your procedure is scheduled on: Friday 07/07/21 ?Report to the Registration Desk on the 1st floor of the [facility]. ?To find out your arrival time, please call [phone] between 1PM - 3PM on: Thursday 07/06/21 ? ?REMEMBER: ?Instructions that are not followed completely may result in serious medical risk, up to and including death; or upon the discretion of your surgeon and anesthesiologist your surgery may need to be rescheduled. ? ?Do not eat or drink after midnight the night before surgery.  ?No gum chewing, lozengers or hard candies. ? ? ?TAKE THESE MEDICATIONS THE MORNING OF SURGERY WITH A SIP OF WATER: ?omeprazole (PRILOSEC) 40 MG capsule (take one the night before and one on the morning of surgery - helps to prevent nausea after surgery.) ? ?Use albuterol (PROVENTIL HFA;VENTOLIN HFA) 108 (90 Base) MCG/ACT inhalers on the day of surgery and bring to the hospital. ? ?Hold your metFORMIN (GLUCOPHAGE-XR) 500 MG 24 hr tablet for 2 days prior to surgery. Last dose on 07/04/21. ? ?One week prior to surgery: ?Stop Anti-inflammatories (NSAIDS) such as Advil, Aleve, Ibuprofen, Motrin, Naproxen, Naprosyn and Aspirin based products such as Excedrin, Goodys Powder, BC Powder. ? ?Stop ANY OVER THE COUNTER supplements until after surgery. ? ?You may however, continue to take Tylenol if needed for pain up until the day of surgery. ? ?No Alcohol for 24 hours before or after surgery. ? ?No Smoking including e-cigarettes for 24 hours prior to surgery.  ?No chewable tobacco products for at least 6 hours prior to surgery.  ?No nicotine patches on the day of surgery. ? ?Do not use any "recreational" drugs for at least a week prior to your surgery.  ?Please be advised that the combination of cocaine and anesthesia may have negative outcomes, up to and including death. ?If you test positive for cocaine, your surgery will be cancelled. ? ?On the morning of surgery brush your teeth with toothpaste and water, you may rinse your mouth  with mouthwash if you wish. ?Do not swallow any toothpaste or mouthwash. ? ?Do not wear jewelry, make-up, hairpins, clips or nail polish. ? ?Do not wear lotions, powders, or perfumes.  ? ?Do not shave body from the neck down 48 hours prior to surgery just in case you cut yourself which could leave a site for infection.  ? ?Do not bring valuables to the hospital. [facility] is not responsible for any missing/lost belongings or valuables.  ? ?Notify your doctor if there is any change in your medical condition (cold, fever, infection). ? ?Wear comfortable clothing (specific to your surgery type) to the hospital. ? ?If you are being discharged the day of surgery, you will not be allowed to drive home. ?You will need a responsible adult (18 years or older) to drive you home and stay with you that night.  ? ?If you are taking public transportation, you will need to have a responsible adult (18 years or older) with you. ?Please confirm with your physician that it is acceptable to use public transportation.  ? ?Please call the [facility] Dept. at [phone] if you have any questions about these instructions. ? ?Surgery Visitation Policy: ? ?Patients undergoing a surgery or procedure may have two family members or support persons with them as long as the person is not COVID-19 positive or experiencing its symptoms.  ? ?Inpatient Visitation:   ? ?Visiting hours are 7 a.m. to 8 p.m. ?Up to four visitors are allowed at one time in a patient room, including children.  The visitors may rotate out with other people during the day. One designated support person (adult) may remain overnight.  ?

## 2021-06-29 LAB — URINE CULTURE

## 2021-07-25 ENCOUNTER — Encounter
Admission: RE | Admit: 2021-07-25 | Discharge: 2021-07-25 | Disposition: A | Discharge status: Home / Self Care | Payer: 59 | Source: Ambulatory Visit | Attending: Urology | Admitting: Urology

## 2021-07-25 NOTE — Patient Instructions (Addendum)
Your procedure is scheduled on: 08/04/21 - Friday Report to the Registration Desk on the 1st floor of the [facility]. To find out your arrival time, please call [phone] between 1PM - 3PM on: 08/03/21 - Thursday If your arrival time is 6:00 am, do not arrive prior to that time as the [facility] entrance doors do not open until 6:00 am.  REMEMBER: Instructions that are not followed completely may result in serious medical risk, up to and including death; or upon the discretion of your surgeon and anesthesiologist your surgery may need to be rescheduled.  TAKE THESE MEDICATIONS THE MORNING OF SURGERY WITH A SIP OF WATER:  - omeprazole (PRILOSEC) 40 MG capsule, (take one the night before and one on the morning of surgery - helps to prevent nausea after surgery.)  Use inhalers albuterol (PROVENTIL ) on the day of surgery and bring to the hospital.  One week prior to surgery: Stop Anti-inflammatories (NSAIDS) such as Advil, Aleve, Ibuprofen, Motrin, Naproxen, Naprosyn and Aspirin based products such as Excedrin, Goodys Powder, BC Powder.  Stop ANY OVER THE COUNTER supplements until after surgery.  You may however, continue to take Tylenol if needed for pain up until the day of surgery.  No Alcohol for 24 hours before or after surgery.  No Smoking including e-cigarettes for 24 hours prior to surgery.  No chewable tobacco products for at least 6 hours prior to surgery.  No nicotine patches on the day of surgery.  Do not use any "recreational" drugs for at least a week prior to your surgery.  Please be advised that the combination of cocaine and anesthesia may have negative outcomes, up to and including death. If you test positive for cocaine, your surgery will be cancelled.  On the morning of surgery brush your teeth with toothpaste and water, you may rinse your mouth with mouthwash if you wish. Do not swallow any toothpaste or mouthwash.  Do not wear jewelry, make-up, hairpins,  clips or nail polish.  Do not wear lotions, powders, or perfumes.   Do not shave body from the neck down 48 hours prior to surgery just in case you cut yourself which could leave a site for infection.  Also, freshly shaved skin may become irritated if using the CHG soap.  Contact lenses, hearing aids and dentures may not be worn into surgery.  Do not bring valuables to the hospital. [facility] is not responsible for any missing/lost belongings or valuables.   Notify your doctor if there is any change in your medical condition (cold, fever, infection).  Wear comfortable clothing (specific to your surgery type) to the hospital.  After surgery, you can help prevent lung complications by doing breathing exercises.  Take deep breaths and cough every 1-2 hours. Your doctor may order a device called an Incentive Spirometer to help you take deep breaths. When coughing or sneezing, hold a pillow firmly against your incision with both hands. This is called "splinting." Doing this helps protect your incision. It also decreases belly discomfort.  If you are being admitted to the hospital overnight, leave your suitcase in the car. After surgery it may be brought to your room.  If you are being discharged the day of surgery, you will not be allowed to drive home. You will need a responsible adult (18 years or older) to drive you home and stay with you that night.   If you are taking public transportation, you will need to have a responsible adult (18  years or older) with you. Please confirm with your physician that it is acceptable to use public transportation.   Please call the [facility] Dept[facility] at [phone] if you have any questions about these instructions.  Surgery Visitation Policy:  Patients undergoing a surgery or procedure may have two family members or support persons with them as long as the person is not COVID-19 positive or experiencing its symptoms.   Inpatient  Visitation:    Visiting hours are 7 a.m. to 8 p.m. Up to four visitors are allowed at one time in a patient room, including children. The visitors may rotate out with other people during the day. One designated support person (adult) may remain overnight.

## 2021-07-25 NOTE — Pre-Procedure Instructions (Signed)
Patient medications reviewed and updated for her surgery coming up on 08/04/21, her original surgery was postponed due to a change in her medical condition which is now resolved, her pre admission appointment was completed on 05/23/21, labs and EKG completed and reviewed. She has no questions or concerns at this time.

## 2021-07-27 ENCOUNTER — Other Ambulatory Visit: Payer: Self-pay

## 2021-07-27 DIAGNOSIS — N329 Bladder disorder, unspecified: Secondary | ICD-10-CM

## 2021-07-28 ENCOUNTER — Other Ambulatory Visit
Admission: RE | Admit: 2021-07-28 | Discharge: 2021-07-28 | Disposition: A | Discharge status: Home / Self Care | Payer: 59 | Attending: Urology | Admitting: Urology

## 2021-07-28 DIAGNOSIS — N329 Bladder disorder, unspecified: Secondary | ICD-10-CM | POA: Diagnosis present

## 2021-07-28 LAB — URINALYSIS, COMPLETE (UACMP) WITH MICROSCOPIC
Bilirubin Urine: NEGATIVE
Glucose, UA: NEGATIVE mg/dL
Hgb urine dipstick: NEGATIVE
Ketones, ur: NEGATIVE mg/dL
Leukocytes,Ua: NEGATIVE
Nitrite: NEGATIVE
Protein, ur: NEGATIVE mg/dL
RBC / HPF: NONE SEEN RBC/hpf (ref 0–5)
Specific Gravity, Urine: 1.01 (ref 1.005–1.030)
pH: 6 (ref 5.0–8.0)

## 2021-07-30 LAB — URINE CULTURE: Culture: 30000 — AB

## 2021-08-04 ENCOUNTER — Encounter
Admission: RE | Disposition: A | Discharge status: Home / Self Care | Payer: Self-pay | Source: Home / Self Care | Attending: Urology

## 2021-08-04 ENCOUNTER — Ambulatory Visit
Admission: RE | Admit: 2021-08-04 | Discharge: 2021-08-04 | Disposition: A | Discharge status: Home / Self Care | Payer: 59 | Attending: Urology | Admitting: Urology

## 2021-08-04 ENCOUNTER — Ambulatory Visit: Payer: 59 | Admitting: Urgent Care

## 2021-08-04 ENCOUNTER — Encounter: Payer: Self-pay | Admitting: Urology

## 2021-08-04 DIAGNOSIS — Z89022 Acquired absence of left finger(s): Secondary | ICD-10-CM | POA: Diagnosis not present

## 2021-08-04 DIAGNOSIS — N329 Bladder disorder, unspecified: Secondary | ICD-10-CM

## 2021-08-04 DIAGNOSIS — Z87891 Personal history of nicotine dependence: Secondary | ICD-10-CM | POA: Diagnosis not present

## 2021-08-04 DIAGNOSIS — E119 Type 2 diabetes mellitus without complications: Secondary | ICD-10-CM | POA: Diagnosis not present

## 2021-08-04 DIAGNOSIS — I10 Essential (primary) hypertension: Secondary | ICD-10-CM | POA: Insufficient documentation

## 2021-08-04 DIAGNOSIS — D090 Carcinoma in situ of bladder: Secondary | ICD-10-CM

## 2021-08-04 HISTORY — PX: CYSTOSCOPY WITH BIOPSY: SHX5122

## 2021-08-04 HISTORY — PX: BLADDER INSTILLATION: SHX6893

## 2021-08-04 LAB — GLUCOSE, CAPILLARY
Glucose-Capillary: 123 mg/dL — ABNORMAL HIGH (ref 70–99)
Glucose-Capillary: 123 mg/dL — ABNORMAL HIGH (ref 70–99)

## 2021-08-04 SURGERY — CYSTOSCOPY, WITH BIOPSY
Anesthesia: General

## 2021-08-04 MED ORDER — DEXAMETHASONE SODIUM PHOSPHATE 10 MG/ML IJ SOLN
INTRAMUSCULAR | Status: DC | PRN
  Administered 2021-08-04: 5 mg via INTRAVENOUS

## 2021-08-04 MED ORDER — GEMCITABINE CHEMO FOR BLADDER INSTILLATION 2000 MG
INTRAVENOUS | Status: DC | PRN
  Administered 2021-08-04: 2000 mg via INTRAVESICAL

## 2021-08-04 MED ORDER — FENTANYL CITRATE (PF) 100 MCG/2ML IJ SOLN
INTRAMUSCULAR | Status: DC | PRN
Start: 2021-08-04 — End: 2021-08-04
  Administered 2021-08-04 (×2): 50 ug via INTRAVENOUS

## 2021-08-04 MED ORDER — DEXAMETHASONE SODIUM PHOSPHATE 10 MG/ML IJ SOLN
INTRAMUSCULAR | Status: AC
Start: 2021-08-04 — End: ?
  Filled 2021-08-04: qty 1

## 2021-08-04 MED ORDER — CEFAZOLIN SODIUM-DEXTROSE 2-4 GM/100ML-% IV SOLN
INTRAVENOUS | Status: AC
  Filled 2021-08-04: qty 100

## 2021-08-04 MED ORDER — FENTANYL CITRATE (PF) 100 MCG/2ML IJ SOLN
25.0000 ug | INTRAMUSCULAR | Status: DC | PRN
  Administered 2021-08-04: 25 ug via INTRAVENOUS
  Administered 2021-08-04: 50 ug via INTRAVENOUS
  Administered 2021-08-04: 25 ug via INTRAVENOUS

## 2021-08-04 MED ORDER — ONDANSETRON HCL 4 MG/2ML IJ SOLN
INTRAMUSCULAR | Status: DC | PRN
  Administered 2021-08-04: 4 mg via INTRAVENOUS

## 2021-08-04 MED ORDER — MIDAZOLAM HCL 2 MG/2ML IJ SOLN
INTRAMUSCULAR | Status: AC
  Filled 2021-08-04: qty 2

## 2021-08-04 MED ORDER — STERILE WATER FOR IRRIGATION IR SOLN
Status: DC | PRN
  Administered 2021-08-04: 3000 mL via INTRAVESICAL

## 2021-08-04 MED ORDER — ONDANSETRON HCL 4 MG/2ML IJ SOLN
INTRAMUSCULAR | Status: AC
  Filled 2021-08-04: qty 2

## 2021-08-04 MED ORDER — ACETAMINOPHEN 10 MG/ML IV SOLN
INTRAVENOUS | Status: AC
  Filled 2021-08-04: qty 100

## 2021-08-04 MED ORDER — CHLORHEXIDINE GLUCONATE 0.12 % MT SOLN
OROMUCOSAL | Status: AC
  Filled 2021-08-04: qty 15

## 2021-08-04 MED ORDER — SUCCINYLCHOLINE CHLORIDE 200 MG/10ML IV SOSY
PREFILLED_SYRINGE | INTRAVENOUS | Status: DC | PRN
  Administered 2021-08-04: 140 mg via INTRAVENOUS

## 2021-08-04 MED ORDER — CEFAZOLIN SODIUM-DEXTROSE 2-4 GM/100ML-% IV SOLN
2.0000 g | INTRAVENOUS | Status: AC
  Administered 2021-08-04: 2 g via INTRAVENOUS

## 2021-08-04 MED ORDER — ORAL CARE MOUTH RINSE: 15.0000 mL | Freq: Once | OROMUCOSAL | Status: AC

## 2021-08-04 MED ORDER — FENTANYL CITRATE (PF) 100 MCG/2ML IJ SOLN
INTRAMUSCULAR | Status: AC
  Filled 2021-08-04: qty 2

## 2021-08-04 MED ORDER — SODIUM CHLORIDE 0.9 % IR SOLN: Status: DC | PRN

## 2021-08-04 MED ORDER — PROPOFOL 10 MG/ML IV BOLUS
INTRAVENOUS | Status: AC
  Filled 2021-08-04: qty 20

## 2021-08-04 MED ORDER — OXYCODONE HCL 5 MG/5ML PO SOLN: 5.0000 mg | Freq: Once | ORAL | Status: DC | PRN

## 2021-08-04 MED ORDER — MIDAZOLAM HCL 2 MG/2ML IJ SOLN
INTRAMUSCULAR | Status: DC | PRN
  Administered 2021-08-04: 2 mg via INTRAVENOUS

## 2021-08-04 MED ORDER — OXYCODONE HCL 5 MG PO TABS: 5.0000 mg | ORAL_TABLET | Freq: Once | ORAL | Status: DC | PRN

## 2021-08-04 MED ORDER — GEMCITABINE CHEMO FOR BLADDER INSTILLATION 2000 MG
2000.0000 mg | Freq: Once | INTRAVENOUS | Status: DC
  Filled 2021-08-04: qty 52.6

## 2021-08-04 MED ORDER — LIDOCAINE HCL (PF) 2 % IJ SOLN
INTRAMUSCULAR | Status: AC
  Filled 2021-08-04: qty 5

## 2021-08-04 MED ORDER — ACETAMINOPHEN 10 MG/ML IV SOLN
INTRAVENOUS | Status: DC | PRN
  Administered 2021-08-04: 1000 mg via INTRAVENOUS

## 2021-08-04 MED ORDER — CHLORHEXIDINE GLUCONATE 0.12 % MT SOLN
15.0000 mL | Freq: Once | OROMUCOSAL | Status: AC
  Administered 2021-08-04: 15 mL via OROMUCOSAL

## 2021-08-04 MED ORDER — LACTATED RINGERS IV SOLN: INTRAVENOUS | Status: DC | PRN

## 2021-08-04 MED ORDER — SODIUM CHLORIDE 0.9 % IV SOLN: INTRAVENOUS | Status: DC

## 2021-08-04 MED ORDER — LIDOCAINE HCL (CARDIAC) PF 100 MG/5ML IV SOSY
PREFILLED_SYRINGE | INTRAVENOUS | Status: DC | PRN
  Administered 2021-08-04: 100 mg via INTRAVENOUS

## 2021-08-04 MED ORDER — DEXMEDETOMIDINE (PRECEDEX) IN NS 20 MCG/5ML (4 MCG/ML) IV SYRINGE
PREFILLED_SYRINGE | INTRAVENOUS | Status: DC | PRN
  Administered 2021-08-04: 8 ug via INTRAVENOUS

## 2021-08-04 MED ORDER — PROPOFOL 10 MG/ML IV BOLUS
INTRAVENOUS | Status: DC | PRN
  Administered 2021-08-04: 50 mg via INTRAVENOUS
  Administered 2021-08-04: 150 mg via INTRAVENOUS

## 2021-08-04 SURGICAL SUPPLY — 22 items
BAG DRAIN CYSTO-URO LG1000N (MISCELLANEOUS) ×2 IMPLANT
BRUSH SCRUB EZ  4% CHG (MISCELLANEOUS) ×1
BRUSH SCRUB EZ 4% CHG (MISCELLANEOUS) ×1 IMPLANT
CATH FOLEY 2WAY 18X30 (CATHETERS) IMPLANT
CATH FOLEY 2WAY SIL 18X30 (CATHETERS) ×2
DRSG TELFA 4X3 1S NADH ST (GAUZE/BANDAGES/DRESSINGS) ×2 IMPLANT
ELECT REM PT RETURN 9FT ADLT (ELECTROSURGICAL) ×2
ELECTRODE REM PT RTRN 9FT ADLT (ELECTROSURGICAL) ×1 IMPLANT
GAUZE 4X4 16PLY ~~LOC~~+RFID DBL (SPONGE) ×2 IMPLANT
GLOVE SURG UNDER POLY LF SZ7.5 (GLOVE) ×2 IMPLANT
GOWN STRL REUS W/ TWL LRG LVL3 (GOWN DISPOSABLE) ×1 IMPLANT
GOWN STRL REUS W/ TWL XL LVL3 (GOWN DISPOSABLE) ×1 IMPLANT
GOWN STRL REUS W/TWL LRG LVL3 (GOWN DISPOSABLE) ×1
GOWN STRL REUS W/TWL XL LVL3 (GOWN DISPOSABLE) ×1
KIT TURNOVER CYSTO (KITS) ×2 IMPLANT
MANIFOLD NEPTUNE II (INSTRUMENTS) ×1 IMPLANT
PACK CYSTO AR (MISCELLANEOUS) ×2 IMPLANT
SET CYSTO W/LG BORE CLAMP LF (SET/KITS/TRAYS/PACK) ×2 IMPLANT
SURGILUBE 2OZ TUBE FLIPTOP (MISCELLANEOUS) ×2 IMPLANT
WATER STERILE IRR 1000ML POUR (IV SOLUTION) ×1 IMPLANT
WATER STERILE IRR 3000ML UROMA (IV SOLUTION) ×2 IMPLANT
WATER STERILE IRR 500ML POUR (IV SOLUTION) ×2 IMPLANT

## 2021-08-04 NOTE — Anesthesia Preprocedure Evaluation (Addendum)
Anesthesia Evaluation  Patient identified by MRN, date of birth, ID band Patient awake    Reviewed: Allergy & Precautions, NPO status , Patient's Chart, lab work & pertinent test results  History of Anesthesia Complications Negative for: history of anesthetic complications  Airway Mallampati: III  TM Distance: <3 FB Neck ROM: full    Dental  (+) Chipped, Poor Dentition, Missing   Pulmonary asthma , COPD, former smoker,    Pulmonary exam normal        Cardiovascular Exercise Tolerance: Good hypertension, (-) angina(-) Past MI and (-) DOE Normal cardiovascular exam     Neuro/Psych negative neurological ROS  negative psych ROS   GI/Hepatic Neg liver ROS, GERD  Controlled,  Endo/Other  diabetes, Type 2  Renal/GU      Musculoskeletal   Abdominal   Peds  Hematology negative hematology ROS (+)   Anesthesia Other Findings Past Medical History: No date: Asthma No date: Cancer ([place])     Comment:  bladder No date: COPD (chronic obstructive pulmonary disease) (HCC) No date: Diabetes mellitus without complication (HCC)     Comment:  TYPE 2 No date: GERD (gastroesophageal reflux disease) No date: Hyperlipidemia No date: Hypertension 2014: MRSA cellulitis     Comment:  abdomen No date: Pneumonia  Past Surgical History: 1978: AMPUTATION FINGER; Left     Comment:  4th distal phalanx 12/30/2020: CYSTOSCOPY WITH BIOPSY; N/A     Comment:  Procedure: CYSTOSCOPY WITH BIOPSY WITH GEMCITABINE;                Surgeon: Billey Co, MD;  Location: [facility] ORS;                Service: Urology;  Laterality: N/A; 1990: TUBAL LIGATION  BMI    Body Mass Index: 42.07 kg/m      Reproductive/Obstetrics negative OB ROS                             Anesthesia Physical Anesthesia Plan  ASA: 3  Anesthesia Plan: General ETT   Post-op Pain Management:    Induction: Intravenous  PONV Risk Score and  Plan: Dexamethasone, Ondansetron, Midazolam and Treatment may vary due to age or medical condition  Airway Management Planned: Oral ETT  Additional Equipment:   Intra-op Plan:   Post-operative Plan: Extubation in OR  Informed Consent: I have reviewed the patients History and Physical, chart, labs and discussed the procedure including the risks, benefits and alternatives for the proposed anesthesia with the patient or authorized representative who has indicated his/her understanding and acceptance.     Dental Advisory Given  Plan Discussed with: Anesthesiologist, CRNA and Surgeon  Anesthesia Plan Comments: (Patient consented for risks of anesthesia including but not limited to:  - adverse reactions to medications - damage to eyes, teeth, lips or other oral mucosa - nerve damage due to positioning  - sore throat or hoarseness - Damage to heart, brain, nerves, lungs, other parts of body or loss of life  Patient voiced understanding.)       Anesthesia Quick Evaluation

## 2021-08-04 NOTE — Transfer of Care (Signed)
Immediate Anesthesia Transfer of Care Note  Patient: Heather Nguyen  Procedure(s) Performed: CYSTOSCOPY WITH BLADDER BIOPSY BLADDER INSTILLATION OF GEMCITABINE  Patient Location: PACU  Anesthesia Type:General  Level of Consciousness: drowsy  Airway & Oxygen Therapy: Patient Spontanous Breathing and Patient connected to face mask oxygen  Post-op Assessment: Report given to RN and Post -op Vital signs reviewed and stable  Post vital signs: Reviewed and stable  Last Vitals:  Vitals Value Taken Time  BP 170/80   Temp    Pulse 87 08/04/21 1530  Resp 15 08/04/21 1530  SpO2 99 % 08/04/21 1530  Vitals shown include unvalidated device data.  Last Pain:  Vitals:   08/04/21 1058  TempSrc: Oral         Complications: No notable events documented.

## 2021-08-04 NOTE — Anesthesia Postprocedure Evaluation (Signed)
Anesthesia Post Note  Patient: Heather Nguyen  Procedure(s) Performed: CYSTOSCOPY WITH BLADDER BIOPSY BLADDER INSTILLATION OF GEMCITABINE  Patient location during evaluation: PACU Anesthesia Type: General Level of consciousness: awake and alert Pain management: pain level controlled Vital Signs Assessment: post-procedure vital signs reviewed and stable Respiratory status: spontaneous breathing, nonlabored ventilation, respiratory function stable and patient connected to nasal cannula oxygen Cardiovascular status: blood pressure returned to baseline and stable Postop Assessment: no apparent nausea or vomiting Anesthetic complications: no   No notable events documented.   Last Vitals:  Vitals:   08/04/21 1632 08/04/21 1643  BP: (!) 164/77 (!) 199/89  Pulse: 76 80  Resp: 15 18  Temp: 36.6 C (!) 36.1 C  SpO2: 96% 98%    Last Pain:  Vitals:   08/04/21 1643  TempSrc: Temporal  PainSc: 0-No pain                 Arita Miss

## 2021-08-04 NOTE — Op Note (Signed)
Date of procedure: 08/04/21  Preoperative diagnosis:  Bladder tumor, carcinoma in situ  Postoperative diagnosis:  Same  Procedure: Cystoscopy, bladder biopsy and fulguration, instillation of gemcitabine  Surgeon: Nickolas Madrid, MD  Anesthesia: General  Complications: None  Intraoperative findings:  Ureteral orifices orthotopic bilaterally Three ~1cm patches of erythema concerning for possible CIS, located at left posterior wall, dome, and right upper lateral wall All areas biopsied and fulgurated, all abnormal tissue removed, excellent hemostasis  EBL: Minimal  Specimens: Bladder lesion  Drains: 18 French Foley  Indication: Heather Nguyen is a 61 y.o. patient with history of CIS and bladder cancer who was found to have erythematous lesions within the bladder as well as a suspicious cytology and opted for biopsy.  After reviewing the management options for treatment, they elected to proceed with the above surgical procedure(s). We have discussed the potential benefits and risks of the procedure, side effects of the proposed treatment, the likelihood of the patient achieving the goals of the procedure, and any potential problems that might occur during the procedure or recuperation. Informed consent has been obtained.  Description of procedure:  The patient was taken to the operating room and general anesthesia was induced. SCDs were placed for DVT prophylaxis. The patient was placed in the dorsal lithotomy position, prepped and draped in the usual sterile fashion, and preoperative antibiotics(Ancef) were administered. A preoperative time-out was performed.   A 21 French rigid cystoscope was inserted into the bladder and thorough cystoscopy performed.  The ureteral orifices were orthotopic bilaterally.  There were 3 patches of erythema approximately 1 cm in size each, located at the left posterior wall, dome, and the right upper lateral wall.  These were concerning for possible CIS.   This was well away from the ureteral orifices.  The cold cup biopsy forcep was used to remove all of these areas in their entirety, and meticulous hemostasis was achieved with the [place].  All abnormal tissue was removed.  With the bladder decompressed there was excellent hemostasis.  An 63 French Foley passed easily into the bladder with return of clear fluid, and 10 mL were placed in the balloon.  The bladder was irrigated with 200 mL of sterile water and remained clear.  2 g / 50 mL gemcitabine was instilled into the bladder via the catheter, and the catheter clamped.  Disposition: Stable to PACU  Plan: Unclamp Foley at 4:20 PM, Foley can be removed prior to discharge We will call with pathology results-> if positive for CIS will repeat induction BCG  Nickolas Madrid, MD

## 2021-08-04 NOTE — H&P (Signed)
   08/04/21 2:40 PM   Heather Nguyen 1960/10/28 [id number]  CC: Bladder tumor  HPI: 61 year old female who presented with irritative urinary symptoms, normal CT urogram, and cystoscopy showed erythematous bladder lesions.  She underwent a TURBT in 12/2020 of > 5 cm abnormal appearing tissue on 12/30/2020 that showed CIS, as well as some early papillary change with possible focal lamina propria invasion.   Induction BCG completed January 2023   Her irritative and overactive urinary symptoms have resolved after TURBT, and she is no longer on oxybutynin.  Clinic cystoscopy showed erythematous tissue in posterior bladder, and cytology was suspicious.     PMH: Past Medical History:  Diagnosis Date   Asthma    Cancer ([place])    bladder   COPD (chronic obstructive pulmonary disease) ([place])    Diabetes mellitus without complication ([place])    TYPE 2   GERD (gastroesophageal reflux disease)    Hyperlipidemia    Hypertension    MRSA cellulitis 2014   abdomen   Pneumonia     Surgical History: Past Surgical History:  Procedure Laterality Date   AMPUTATION FINGER Left 1978   4th distal phalanx   CYSTOSCOPY WITH BIOPSY N/A 12/30/2020   Procedure: CYSTOSCOPY WITH BIOPSY WITH GEMCITABINE;  Surgeon: Billey Co, MD;  Location: [facility] ORS;  Service: Urology;  Laterality: N/A;   TUBAL LIGATION  1990     Family History: Family History  Problem Relation Age of Onset   CAD Father    Asthma Mother    Breast cancer Cousin 33       pat cousin    Social History:  reports that she quit smoking about a year ago. Her smoking use included cigarettes. She has a 35.00 pack-year smoking history. She has never used smokeless tobacco. She reports current drug use. Drug: Marijuana. She reports that she does not drink alcohol.  Physical Exam: BP (!) 187/94   Pulse 86   Temp 97.7 F (36.5 C) (Oral)   Resp 16   Ht '5\' 2"'$  (1.575 m)   Wt 104.3 kg   BMI 42.07 kg/m    Constitutional:  Alert  and oriented, No acute distress. Cardiovascular: RRR Respiratory: CTA bilaterally GI: Abdomen is soft, nontender, nondistended, no abdominal masses   Laboratory Data: Urine culture 07/28/21 30k mixed species consistent with  Assessment & Plan:   61 year old female with history of CIS with any irritative symptoms with recurrence of erythema at the posterior bladder wall and suspicious cytology.  We discussed transurethral resection of bladder tumor (TURBT) and risks and benefits at length. This is typically a 1 to 2-hour procedure done under general anesthesia in the operating room.  A scope is inserted through the urethra and used to resect abnormal tissue within the bladder, which is then sent to the pathologist to determine grade and stage of the tumor.  Risks include bleeding, infection, need for temporary Foley placement, and bladder perforation.  Treatment strategies are based on the type of tumor and depth of invasion.  We briefly reviewed the different treatment pathways for non-muscle invasive and muscle invasive bladder cancer.  Bladder biopsy and fulguration, gemcitabine today  Nickolas Madrid, MD 08/04/2021  [facility] [address] [place], [place] [address] [phone]

## 2021-08-04 NOTE — Progress Notes (Signed)
Patient foley unclamped at 1620 per Dr. Diamantina Providence order. Patient has tolerated gemcitabine instillation well and has been very Minervia Osso. Allowing the foley to drain at this time, then will d/c foley according to Dr. Diamantina Providence order. Explained process to patient.

## 2021-08-04 NOTE — Discharge Instructions (Signed)

## 2021-08-04 NOTE — Anesthesia Procedure Notes (Signed)
Procedure Name: Intubation Date/Time: 08/04/2021 2:59 PM Performed by: Lily Peer, Hildagarde Holleran, CRNA Pre-anesthesia Checklist: Patient identified, Emergency Drugs available, Suction available and Patient being monitored Patient Re-evaluated:Patient Re-evaluated prior to induction Oxygen Delivery Method: Circle system utilized Preoxygenation: Pre-oxygenation with 100% oxygen Induction Type: IV induction Ventilation: Mask ventilation without difficulty Laryngoscope Size: McGraph and 3 Grade View: Grade I Tube type: Oral Number of attempts: 1 Airway Equipment and Method: Stylet Placement Confirmation: ETT inserted through vocal cords under direct vision, positive ETCO2 and breath sounds checked- equal and bilateral Secured at: 20 cm Tube secured with: Tape Dental Injury: Teeth and Oropharynx as per pre-operative assessment

## 2021-08-05 ENCOUNTER — Encounter: Payer: Self-pay | Admitting: Urology

## 2021-08-08 LAB — SURGICAL PATHOLOGY

## 2021-08-09 ENCOUNTER — Telehealth: Payer: Self-pay

## 2021-08-09 NOTE — Telephone Encounter (Signed)
Called pt informed her of the information below. Pt voiced understanding.  

## 2021-08-09 NOTE — Telephone Encounter (Signed)
-----   Message from Billey Co, MD sent at 08/09/2021  8:17 AM EDT ----- Biopsy did show recurrence of her bladder cancer as we suspected, recommend repeat induction BCG x 6 sessions to start in 4-6 weeks from now. OK  to schedule clinic visit if she would like to discuss further  Nickolas Madrid, MD 08/09/2021

## 2021-08-28 ENCOUNTER — Telehealth: Payer: Self-pay

## 2021-09-18 ENCOUNTER — Ambulatory Visit: Payer: 59 | Admitting: Urology

## 2021-09-22 ENCOUNTER — Ambulatory Visit: Payer: 59 | Admitting: Physician Assistant

## 2021-09-25 ENCOUNTER — Ambulatory Visit: Payer: 59 | Admitting: Urology

## 2021-09-29 ENCOUNTER — Encounter: Payer: Self-pay | Admitting: Physician Assistant

## 2021-09-29 ENCOUNTER — Encounter: Payer: Self-pay | Admitting: *Deleted

## 2021-09-29 ENCOUNTER — Ambulatory Visit: Payer: 59 | Admitting: Physician Assistant

## 2021-09-29 VITALS — BP 172/89 | HR 84 | Ht 62.0 in | Wt 230.0 lb

## 2021-09-29 DIAGNOSIS — E785 Hyperlipidemia, unspecified: Secondary | ICD-10-CM | POA: Insufficient documentation

## 2021-09-29 DIAGNOSIS — F419 Anxiety disorder, unspecified: Secondary | ICD-10-CM | POA: Insufficient documentation

## 2021-09-29 DIAGNOSIS — E669 Obesity, unspecified: Secondary | ICD-10-CM | POA: Insufficient documentation

## 2021-09-29 DIAGNOSIS — R109 Unspecified abdominal pain: Secondary | ICD-10-CM

## 2021-09-29 DIAGNOSIS — M1990 Unspecified osteoarthritis, unspecified site: Secondary | ICD-10-CM | POA: Insufficient documentation

## 2021-09-29 DIAGNOSIS — C672 Malignant neoplasm of lateral wall of bladder: Secondary | ICD-10-CM | POA: Diagnosis not present

## 2021-09-29 DIAGNOSIS — R110 Nausea: Secondary | ICD-10-CM

## 2021-09-29 DIAGNOSIS — J309 Allergic rhinitis, unspecified: Secondary | ICD-10-CM | POA: Insufficient documentation

## 2021-09-29 DIAGNOSIS — F32A Depression, unspecified: Secondary | ICD-10-CM | POA: Insufficient documentation

## 2021-09-29 LAB — URINALYSIS, COMPLETE
Bilirubin, UA: NEGATIVE
Glucose, UA: NEGATIVE
Ketones, UA: NEGATIVE
Leukocytes,UA: NEGATIVE
Nitrite, UA: NEGATIVE
Protein,UA: NEGATIVE
RBC, UA: NEGATIVE
Specific Gravity, UA: 1.025 (ref 1.005–1.030)
Urobilinogen, Ur: 0.2 mg/dL (ref 0.2–1.0)
pH, UA: 5 (ref 5.0–7.5)

## 2021-09-29 LAB — MICROSCOPIC EXAMINATION
Epithelial Cells (non renal): >10 /hpf — AB (ref 0–10)
RBC, Urine: NONE SEEN /hpf (ref 0–2)

## 2021-09-29 MED ORDER — BCG LIVE 50 MG IS SUSR
3.2400 mL | Freq: Once | INTRAVESICAL | Status: AC
  Administered 2021-09-29: 81 mg via INTRAVESICAL

## 2021-09-29 NOTE — Progress Notes (Signed)
BCG Bladder Instillation  BCG # 1 of 6  Due to Bladder Cancer patient is present today for a BCG treatment. Patient was cleaned and prepped in a sterile fashion with betadine. A 14FR catheter was inserted, urine return was noted 84m, urine was yellow in color.  529mof reconstituted BCG was instilled into the bladder. The catheter was then removed. Patient tolerated well, no complications were noted  Performed by: SaDebroah LoopPA-C and DeEdwin DadaCMA  Additional notes: Reinduction BCG started today; no need to keep her in clinic to monitor for hypersensitivity reaction. We reviewed post-instillation instructions including holding the urine, quarter turns, and bleaching her urine. She verbalized recollection and declined repeat documentation.  Today she reports about one week of nonradiating right flank pain and nausea, which are atypical for her. UA is benign; low concern for UTI today. Will order a renal ultrasound to evaluate for right hydronephrosis that could be causing her symptoms.  Follow up: 1 week for BCG #2 of 6

## 2021-10-02 ENCOUNTER — Ambulatory Visit: Payer: 59 | Admitting: Urology

## 2021-10-04 ENCOUNTER — Ambulatory Visit
Admission: RE | Admit: 2021-10-04 | Discharge: 2021-10-04 | Disposition: A | Discharge status: Home / Self Care | Payer: 59 | Source: Ambulatory Visit | Attending: Physician Assistant | Admitting: Physician Assistant

## 2021-10-04 DIAGNOSIS — R109 Unspecified abdominal pain: Secondary | ICD-10-CM | POA: Diagnosis present

## 2021-10-06 ENCOUNTER — Ambulatory Visit: Payer: 59 | Admitting: Physician Assistant

## 2021-10-06 DIAGNOSIS — C679 Malignant neoplasm of bladder, unspecified: Secondary | ICD-10-CM

## 2021-10-06 DIAGNOSIS — Z8551 Personal history of malignant neoplasm of bladder: Secondary | ICD-10-CM

## 2021-10-06 LAB — MICROSCOPIC EXAMINATION

## 2021-10-06 LAB — URINALYSIS, COMPLETE
Bilirubin, UA: NEGATIVE
Glucose, UA: NEGATIVE
Ketones, UA: NEGATIVE
Nitrite, UA: NEGATIVE
Protein,UA: NEGATIVE
RBC, UA: NEGATIVE
Specific Gravity, UA: <1.005 — ABNORMAL LOW (ref 1.005–1.030)
Urobilinogen, Ur: 0.2 mg/dL (ref 0.2–1.0)
pH, UA: 5 (ref 5.0–7.5)

## 2021-10-06 MED ORDER — BCG LIVE 50 MG IS SUSR
3.2400 mL | Freq: Once | INTRAVESICAL | Status: AC
  Administered 2021-10-06: 81 mg via INTRAVESICAL

## 2021-10-06 NOTE — Progress Notes (Signed)
BCG Bladder Instillation  BCG # 2 of 6  Due to Bladder Cancer patient is present today for a BCG treatment. Patient was cleaned and prepped in a sterile fashion with betadine. A 14FR catheter was inserted, urine return was noted 27m, urine was yellow in color.  570mof reconstituted BCG was instilled into the bladder. The catheter was then removed. Patient tolerated well, no complications were noted  Performed by: SaDebroah LoopPA-C and OkGordy ClementCMThoreauFollow up/ Additional notes: 1 week for BCG #3 of 6

## 2021-10-10 LAB — CULTURE, URINE COMPREHENSIVE

## 2021-10-13 ENCOUNTER — Ambulatory Visit: Payer: 59 | Admitting: Physician Assistant

## 2021-10-13 DIAGNOSIS — C672 Malignant neoplasm of lateral wall of bladder: Secondary | ICD-10-CM | POA: Diagnosis not present

## 2021-10-13 DIAGNOSIS — R350 Frequency of micturition: Secondary | ICD-10-CM | POA: Diagnosis not present

## 2021-10-13 LAB — URINALYSIS, COMPLETE
Bilirubin, UA: NEGATIVE
Glucose, UA: NEGATIVE
Ketones, UA: NEGATIVE
Nitrite, UA: NEGATIVE
Protein,UA: NEGATIVE
RBC, UA: NEGATIVE
Specific Gravity, UA: <1.005 — ABNORMAL LOW (ref 1.005–1.030)
Urobilinogen, Ur: 0.2 mg/dL (ref 0.2–1.0)
pH, UA: 5.5 (ref 5.0–7.5)

## 2021-10-13 LAB — MICROSCOPIC EXAMINATION: Epithelial Cells (non renal): >10 /hpf — AB (ref 0–10)

## 2021-10-13 MED ORDER — GEMTESA 75 MG PO TABS: 75.0000 mg | ORAL_TABLET | Freq: Every day | ORAL | 0 refills | Status: DC

## 2021-10-13 MED ORDER — BCG LIVE 50 MG IS SUSR
3.2400 mL | Freq: Once | INTRAVESICAL | Status: AC
  Administered 2021-10-13: 81 mg via INTRAVESICAL

## 2021-10-13 NOTE — Progress Notes (Signed)
BCG Bladder Instillation  BCG # 3 of 6  Due to Bladder Cancer patient is present today for a BCG treatment. Patient was cleaned and prepped in a sterile fashion with betadine. A 14FR catheter was inserted, urine return was noted 5m, urine was yellow in color.  574mof reconstituted BCG was instilled into the bladder. The catheter was then removed. Patient tolerated well, no complications were noted  Performed by: SaDebroah LoopPA-C and CrBradly BienenstockCMA  Additional notes: Patient reports new frequency since starting BCG, up to 3 times hourly. She denies dysuria. Will start Gemtesa, samples provided.   She also reports malodorous urine x1-2 weeks. UA contaminated today. We obtained a cath specimen for culture and will treat based on results given weekly BCG treatments, though my suspicion for infection is low at this time. Will call with results.  Follow up: 1 week for BCG #4 of 6

## 2021-10-16 ENCOUNTER — Ambulatory Visit: Payer: 59 | Admitting: Urology

## 2021-10-17 LAB — CULTURE, URINE COMPREHENSIVE

## 2021-10-20 ENCOUNTER — Encounter: Payer: Self-pay | Admitting: Physician Assistant

## 2021-10-20 ENCOUNTER — Ambulatory Visit: Payer: 59 | Admitting: Physician Assistant

## 2021-10-20 DIAGNOSIS — C672 Malignant neoplasm of lateral wall of bladder: Secondary | ICD-10-CM

## 2021-10-20 DIAGNOSIS — N3000 Acute cystitis without hematuria: Secondary | ICD-10-CM | POA: Diagnosis not present

## 2021-10-20 LAB — URINALYSIS, COMPLETE
Bilirubin, UA: NEGATIVE
Glucose, UA: NEGATIVE
Ketones, UA: NEGATIVE
Nitrite, UA: NEGATIVE
Protein,UA: NEGATIVE
RBC, UA: NEGATIVE
Specific Gravity, UA: 1.01 (ref 1.005–1.030)
Urobilinogen, Ur: 0.2 mg/dL (ref 0.2–1.0)
pH, UA: 5 (ref 5.0–7.5)

## 2021-10-20 LAB — MICROSCOPIC EXAMINATION: WBC, UA: >30 /hpf — AB (ref 0–5)

## 2021-10-20 MED ORDER — SULFAMETHOXAZOLE-TRIMETHOPRIM 800-160 MG PO TABS: 1.0000 | ORAL_TABLET | Freq: Two times a day (BID) | ORAL | 0 refills | Status: AC

## 2021-10-20 NOTE — Progress Notes (Signed)
Patient presented to the clinic today for a scheduled BCG instillation.  Urine grossly infected on UA, culture pending.  Patient reports increased urgency, frequency, and RLQ pain this week.  Will treat for acute UTI today.  Prescription for Bactrim DS BID x5 days sent to pharmacy.  Patient to return to clinic next week for next scheduled BCG treatment.  We will add an additional treatment to her scheduled series to make up for today's missed instillation.  Debroah Loop, PA-C 10/20/21 9:08 AM

## 2021-10-23 ENCOUNTER — Ambulatory Visit: Payer: 59 | Admitting: Urology

## 2021-10-24 LAB — CULTURE, URINE COMPREHENSIVE

## 2021-10-25 ENCOUNTER — Other Ambulatory Visit: Payer: Self-pay | Admitting: Gerontology

## 2021-10-25 DIAGNOSIS — C679 Malignant neoplasm of bladder, unspecified: Secondary | ICD-10-CM

## 2021-10-26 IMAGING — CT CT ABD-PEL WO/W CM
3 of 8 series · 10 of 46 positions shown, 17 images · IV contrast (APPLIED)
Comparison: None.

CLINICAL DATA: Gross hematuria for 1 month. Urinary frequency and
urgency.

EXAM:
CT ABDOMEN AND PELVIS WITHOUT AND WITH CONTRAST
TECHNIQUE: Multidetector CT imaging of the abdomen and pelvis was performed
following the standard protocol before and following the bolus
administration of intravenous contrast.
CONTRAST:  125mL OMNIPAQUE IOHEXOL 300 MG/ML  SOLN

[Series 7: axial delay · axial · delayed · 0.95mm/px · z∈[-907,-567]mm · 6 of 96 slices shown, 11 images]
[im 14/96  soft-tissue]
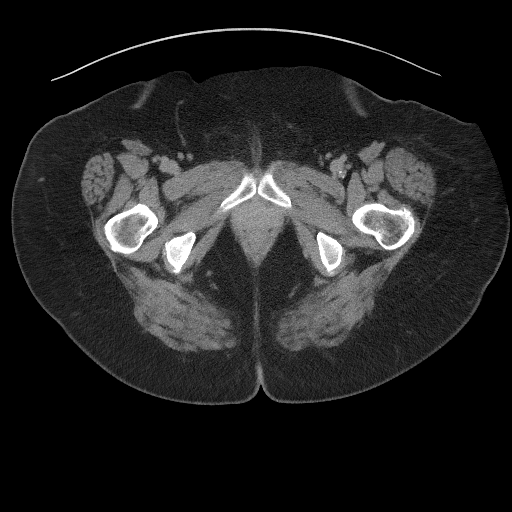
[im 14/96  bone]
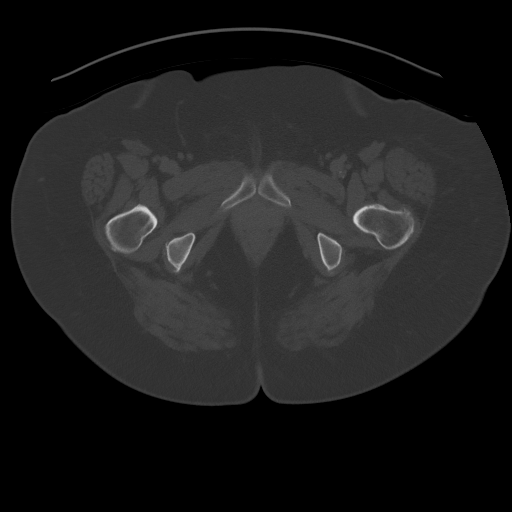
[im 28/96  soft-tissue]
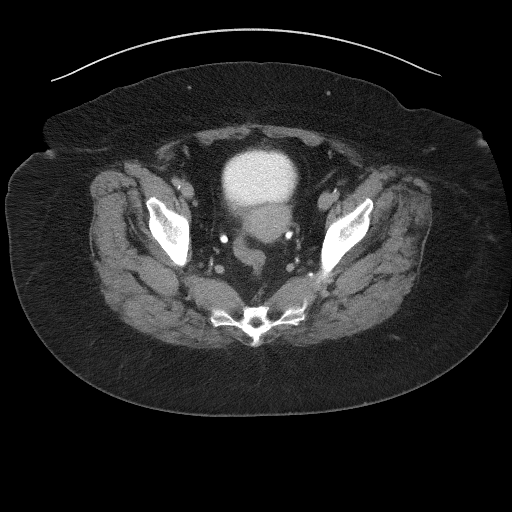
[im 41/96  soft-tissue]
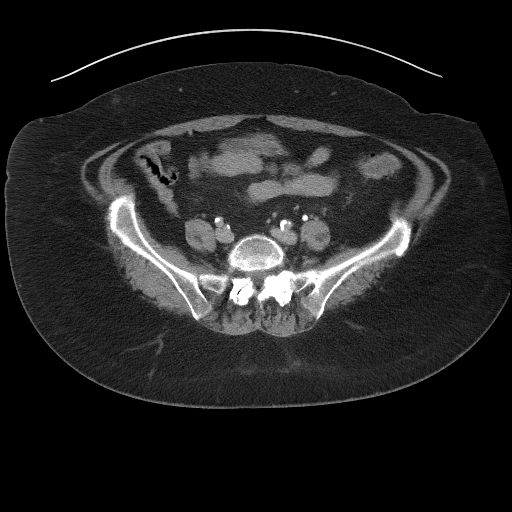
[im 41/96  lung]
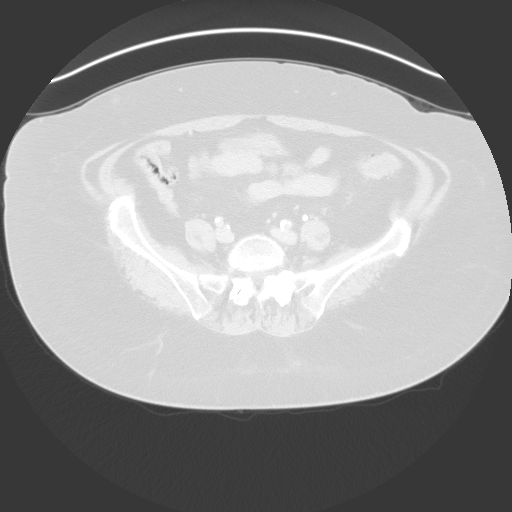
[im 55/96  soft-tissue]
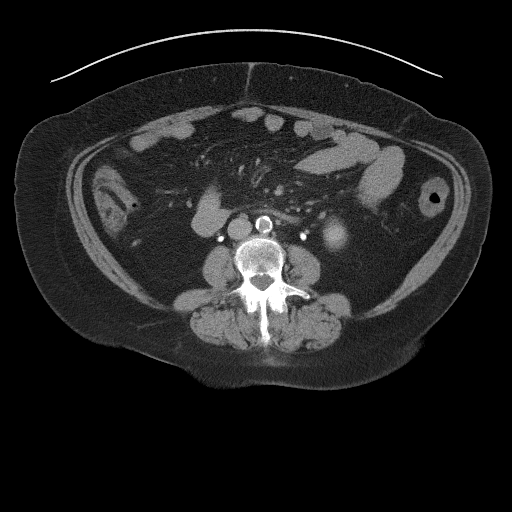
[im 55/96  lung]
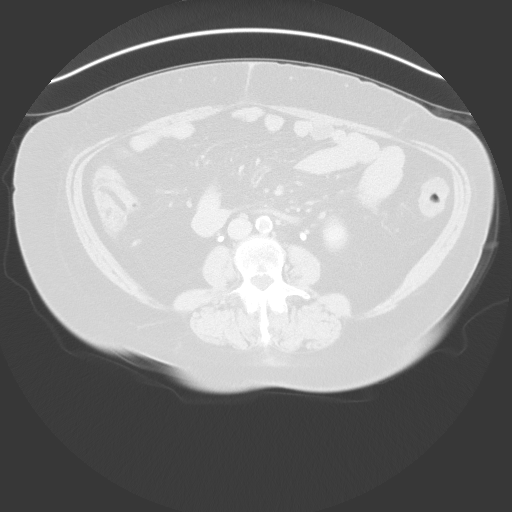
[im 68/96  soft-tissue]
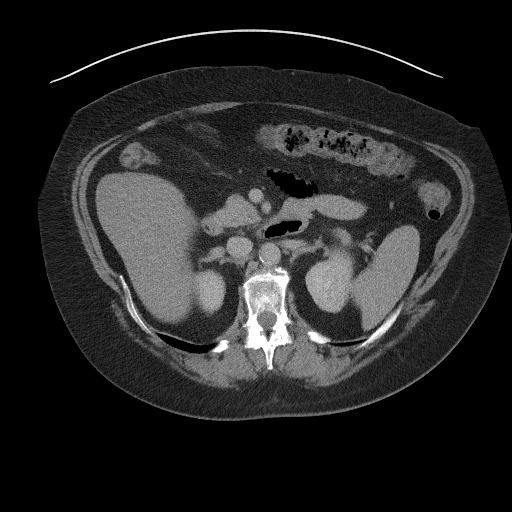
[im 68/96  lung]
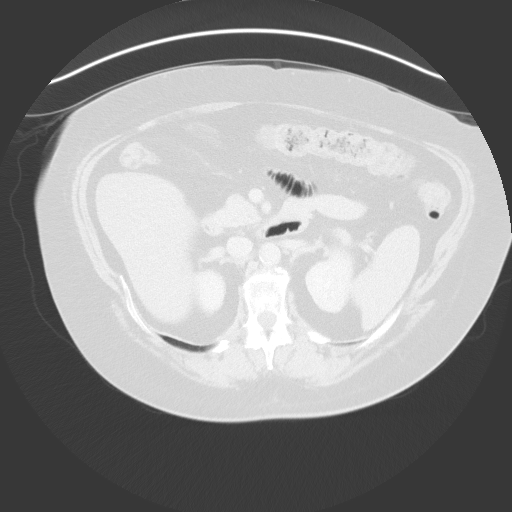
[im 82/96  soft-tissue]
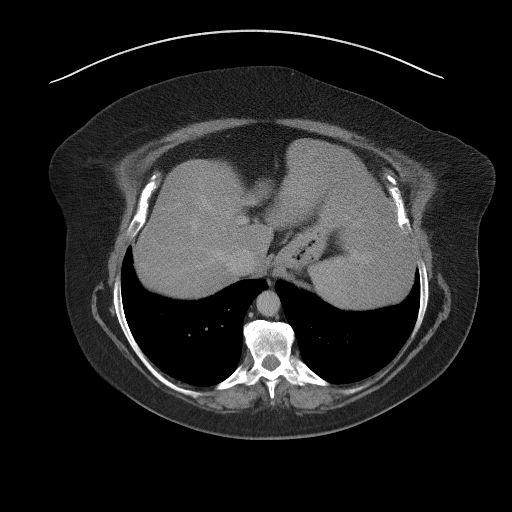
[im 82/96  lung]
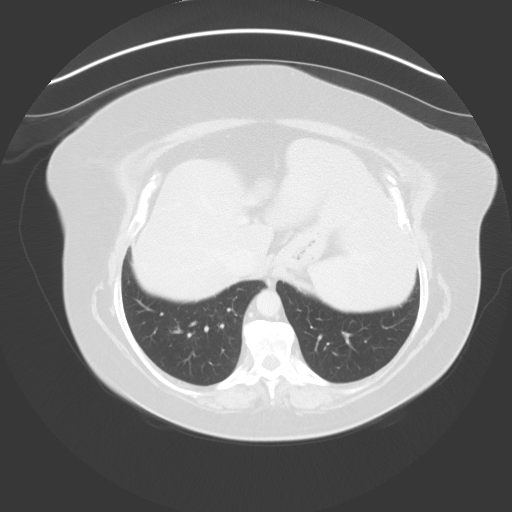

[Series 8003: coronal · coronal · 0.93mm/px · 3 of 70 slices shown, 4 images]
[im 24/70  soft-tissue]
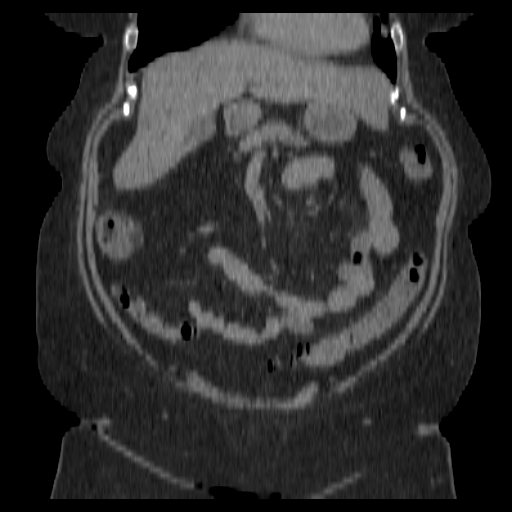
[im 31/70  soft-tissue]
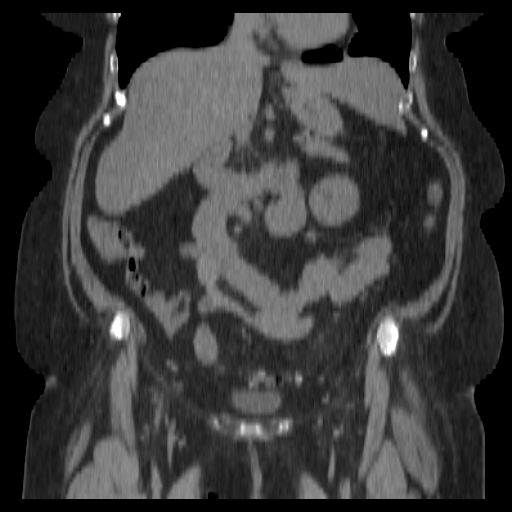
[im 31/70  bone]
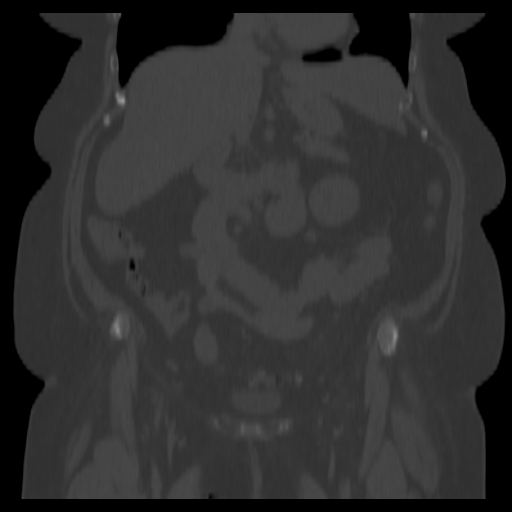
[im 39/70  soft-tissue]
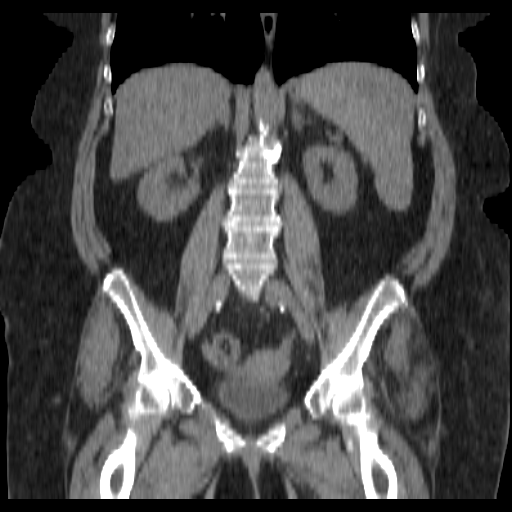

[Series 8004: sag · sagittal · 0.93mm/px · 1 of 87 slices shown, 2 images]
[im 29/87  soft-tissue]
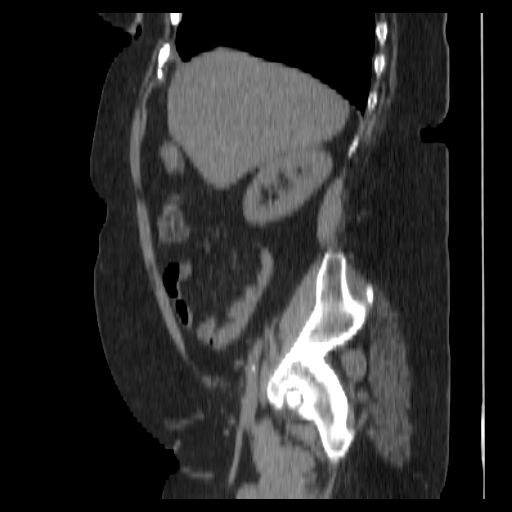
[im 29/87  bone]
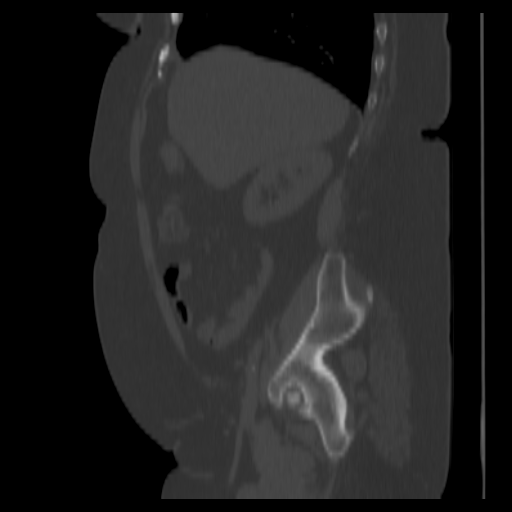

[10 of 46 positions shown; findings below may reference images not displayed]

FINDINGS: Lower Chest: No acute findings.

Hepatobiliary: No hepatic masses identified. Mild diffuse hepatic
steatosis noted. Gallbladder is unremarkable. No evidence of biliary
ductal dilatation.

Pancreas:  No mass or inflammatory changes.

Spleen: Within normal limits in size and appearance.

Adrenals/Urinary Tract: No adrenal masses identified. No evidence of
urolithiasis or hydronephrosis. No complex cystic or solid renal
masses identified. No masses seen involving the collecting systems,
ureters, or bladder. Mild asymmetric wall thickening seen involving
the right posterolateral bladder wall, and this could be due to
cystitis or urothelial carcinoma.

Stomach/Bowel: Mild diverticulitis is seen involving the proximal
sigmoid colon. No evidence of abscess or bowel obstruction.

Vascular/Lymphatic: No pathologically enlarged lymph nodes. No acute
vascular findings. Aortic atherosclerotic calcification noted.

Reproductive:  No mass or other significant abnormality.

Other:  None.

Musculoskeletal:  No suspicious bone lesions identified.
IMPRESSION: No evidence of urolithiasis or hydronephrosis.

Mild asymmetric wall thickening involving the right posterolateral
bladder wall, which could be due to cystitis or urothelial
carcinoma. Recommend cystoscopy for further evaluation.

Mild sigmoid diverticulitis. No evidence of abscess or other
complication.

Mild hepatic steatosis.   Aortic Atherosclerosis ([id number]).

These results will be called to the ordering clinician or
representative by the Radiologist Assistant, and communication
documented in the PACS or [REDACTED].

## 2021-10-27 ENCOUNTER — Encounter: Payer: Self-pay | Admitting: Physician Assistant

## 2021-10-27 ENCOUNTER — Ambulatory Visit: Payer: 59 | Admitting: Physician Assistant

## 2021-10-27 VITALS — Ht 62.0 in | Wt 230.0 lb

## 2021-10-27 DIAGNOSIS — C672 Malignant neoplasm of lateral wall of bladder: Secondary | ICD-10-CM | POA: Diagnosis not present

## 2021-10-27 LAB — URINALYSIS, COMPLETE
Bilirubin, UA: NEGATIVE
Glucose, UA: NEGATIVE
Ketones, UA: NEGATIVE
Leukocytes,UA: NEGATIVE
Nitrite, UA: NEGATIVE
Protein,UA: NEGATIVE
Specific Gravity, UA: <1.005 — ABNORMAL LOW (ref 1.005–1.030)
Urobilinogen, Ur: 0.2 mg/dL (ref 0.2–1.0)
pH, UA: 5 (ref 5.0–7.5)

## 2021-10-27 LAB — MICROSCOPIC EXAMINATION

## 2021-10-27 MED ORDER — BCG LIVE 50 MG IS SUSR
3.2400 mL | Freq: Once | INTRAVESICAL | Status: AC
  Administered 2021-10-27: 81 mg via INTRAVESICAL

## 2021-10-27 NOTE — Progress Notes (Signed)
BCG Bladder Instillation  BCG # 4 of 6  Due to Bladder Cancer patient is present today for a BCG treatment. Patient was cleaned and prepped in a sterile fashion with betadine. A 14FR catheter was inserted, urine return was noted 179m, urine was yellow in color.  537mof reconstituted BCG was instilled into the bladder. The catheter was then removed. Patient tolerated well, no complications were noted  Performed by: SaDebroah LoopPA-C and DeEdwin DadaCMA  Additional notes: Patient reports concern for yeast infection this week. She was prescribed Diflucan '150mg'$  x1 dose by another provider and took it 3 days ago. She has had persistent symptoms and wonders if she should have another dose.  Counseled her that it can take up to 1 week to have symptomatic improvement after taking Diflucan. Asked her to reach out to me via MyChart next Tuesday if she is still symptomatic, in which case will prescribe a second dose. In the meantime, she may use Monistat cream for symptom relief. If her symptoms persist despite 2 doses, will have her follow up with her PCP vs OBGYN.  Follow up/ Additional notes: 1 week for BCG #5 of 6

## 2021-10-31 ENCOUNTER — Other Ambulatory Visit: Payer: Self-pay | Admitting: Physician Assistant

## 2021-10-31 DIAGNOSIS — B3731 Acute candidiasis of vulva and vagina: Secondary | ICD-10-CM

## 2021-10-31 MED ORDER — FLUCONAZOLE 150 MG PO TABS: 150.0000 mg | ORAL_TABLET | Freq: Once | ORAL | 0 refills | Status: AC

## 2021-11-03 ENCOUNTER — Ambulatory Visit: Payer: 59 | Admitting: Physician Assistant

## 2021-11-03 DIAGNOSIS — C672 Malignant neoplasm of lateral wall of bladder: Secondary | ICD-10-CM | POA: Diagnosis not present

## 2021-11-03 LAB — URINALYSIS, COMPLETE
Bilirubin, UA: NEGATIVE
Glucose, UA: NEGATIVE
Ketones, UA: NEGATIVE
Nitrite, UA: NEGATIVE
Protein,UA: NEGATIVE
RBC, UA: NEGATIVE
Specific Gravity, UA: <1.005 — ABNORMAL LOW (ref 1.005–1.030)
Urobilinogen, Ur: 0.2 mg/dL (ref 0.2–1.0)
pH, UA: 5.5 (ref 5.0–7.5)

## 2021-11-03 LAB — MICROSCOPIC EXAMINATION

## 2021-11-03 MED ORDER — BCG LIVE 50 MG IS SUSR
3.2400 mL | Freq: Once | INTRAVESICAL | Status: AC
  Administered 2021-11-03: 81 mg via INTRAVESICAL

## 2021-11-03 NOTE — Progress Notes (Signed)
BCG Bladder Instillation  BCG # 5 of 6  Due to Bladder Cancer patient is present today for a BCG treatment. Patient was cleaned and prepped in a sterile fashion with betadine. A 14FR catheter was inserted, urine return was noted 7m, urine was yellow in color.  545mof reconstituted BCG was instilled into the bladder. The catheter was then removed. Patient tolerated well, no complications were noted  Performed by: SaDebroah LoopPA-C and ShElizabeth PalauCMA  Follow up/ Additional notes: 1 week for BCG #6 of 6

## 2021-11-07 ENCOUNTER — Other Ambulatory Visit: Payer: 59

## 2021-11-10 ENCOUNTER — Ambulatory Visit: Payer: 59 | Admitting: Physician Assistant

## 2021-11-10 DIAGNOSIS — R350 Frequency of micturition: Secondary | ICD-10-CM

## 2021-11-10 DIAGNOSIS — C679 Malignant neoplasm of bladder, unspecified: Secondary | ICD-10-CM

## 2021-11-10 LAB — MICROSCOPIC EXAMINATION

## 2021-11-10 LAB — URINALYSIS, COMPLETE
Bilirubin, UA: NEGATIVE
Glucose, UA: NEGATIVE
Ketones, UA: NEGATIVE
Nitrite, UA: NEGATIVE
Protein,UA: NEGATIVE
Specific Gravity, UA: <1.005 — ABNORMAL LOW (ref 1.005–1.030)
Urobilinogen, Ur: 0.2 mg/dL (ref 0.2–1.0)
pH, UA: 5.5 (ref 5.0–7.5)

## 2021-11-10 MED ORDER — BCG LIVE 50 MG IS SUSR
3.2400 mL | Freq: Once | INTRAVESICAL | Status: AC
  Administered 2021-11-10: 81 mg via INTRAVESICAL

## 2021-11-10 NOTE — Progress Notes (Signed)
BCG Bladder Instillation  BCG # 6 of 6  Due to Bladder Cancer patient is present today for a BCG treatment. Patient was cleaned and prepped in a sterile fashion with betadine. A 14FR catheter was inserted, urine return was noted 43m, urine was yellow in color.  561mof reconstituted BCG was instilled into the bladder. The catheter was then removed. Patient tolerated well, no complications were noted  Performed by: SaDebroah LoopPA-C and ReVerlene MayerCMA  Follow up/ Additional notes: Cysto with Dr. SnDiamantina Providencen October

## 2021-11-14 ENCOUNTER — Ambulatory Visit
Admission: RE | Admit: 2021-11-14 | Discharge: 2021-11-14 | Disposition: A | Discharge status: Home / Self Care | Payer: 59 | Source: Ambulatory Visit | Attending: Gerontology | Admitting: Gerontology

## 2021-11-14 DIAGNOSIS — C679 Malignant neoplasm of bladder, unspecified: Secondary | ICD-10-CM

## 2021-11-14 MED ORDER — IOPAMIDOL (ISOVUE-300) INJECTION 61%
100.0000 mL | Freq: Once | INTRAVENOUS | Status: AC | PRN
Start: 2021-11-14 — End: 2021-11-14
  Administered 2021-11-14: 100 mL via INTRAVENOUS

## 2021-12-19 ENCOUNTER — Other Ambulatory Visit: Payer: 59 | Admitting: Urology

## 2021-12-26 ENCOUNTER — Ambulatory Visit
Admission: RE | Admit: 2021-12-26 | Discharge: 2021-12-26 | Disposition: A | Discharge status: Home / Self Care | Payer: 59 | Attending: Urology | Admitting: Urology

## 2021-12-26 ENCOUNTER — Ambulatory Visit (INDEPENDENT_AMBULATORY_CARE_PROVIDER_SITE_OTHER): Payer: 59 | Admitting: Urology

## 2021-12-26 ENCOUNTER — Other Ambulatory Visit: Payer: Self-pay | Admitting: *Deleted

## 2021-12-26 ENCOUNTER — Encounter: Payer: Self-pay | Admitting: Urology

## 2021-12-26 VITALS — Ht 62.0 in | Wt 230.0 lb

## 2021-12-26 DIAGNOSIS — D090 Carcinoma in situ of bladder: Secondary | ICD-10-CM

## 2021-12-26 DIAGNOSIS — C672 Malignant neoplasm of lateral wall of bladder: Secondary | ICD-10-CM

## 2021-12-26 DIAGNOSIS — N3281 Overactive bladder: Secondary | ICD-10-CM | POA: Diagnosis not present

## 2021-12-26 LAB — URINALYSIS, COMPLETE (UACMP) WITH MICROSCOPIC
Bilirubin Urine: NEGATIVE
Glucose, UA: NEGATIVE mg/dL
Hgb urine dipstick: NEGATIVE
Ketones, ur: NEGATIVE mg/dL
Leukocytes,Ua: NEGATIVE
Nitrite: NEGATIVE
Protein, ur: NEGATIVE mg/dL
Specific Gravity, Urine: 1.01 (ref 1.005–1.030)
pH: 6.5 (ref 5.0–8.0)

## 2021-12-26 NOTE — Patient Instructions (Signed)

## 2021-12-26 NOTE — Progress Notes (Signed)
Bladder cancer surveillance note  INDICATION CIS  UROLOGIC HISTORY 61 year old female who originally presented with irritative urinary symptoms, normal CT urogram, and cystoscopy showing at least 5 cm of erythematous bladder tissue, TURBT on 12/30/2020 showed CIS as well as some early papillary change with possible focal lamina propria invasion.  Her irritative urinary symptoms have essentially resolved after TURBT and no longer taking oxybutynin.  She had a recurrence in February 2023 with erythema at the posterior bladder wall and suspicious cytology, and underwent bladder biopsy and fulguration with gemcitabine on 08/04/2021 with pathology showing CIS.  Initial Diagnosis of Bladder  Date: 12/2020 Pathology: CIS, early papillary change with possible focal lamina propria invasion  Recurrent Bladder Cancer Diagnosis  Date  Pathology  08/2021 CIS  Second round of induction BCG completed 11/2021   AUA Risk Category High  Cystoscopy Procedure Note:  After informed consent and discussion of the procedure and its risks, Heather Nguyen was positioned and prepped in the standard fashion. Cystoscopy was performed with the a flexible cystoscope. The urethra, bladder neck and entire bladder was visualized in a standard fashion. The ureteral orifices were visualized in their normal location and orientation.  There was some very subtle erythema at prior resection site at the bladder base, but no suspicious lesions, no abnormalities on retroflexion.  -Continue maintenance BCG, due early December 2023 -Cystoscopy January 2023, with BCG after that in in February 2023, then August 2023 if no evidence of recurrence  --------------------------------------------------------------------------------------------------  We also discussed her overactive bladder symptoms.  Fortunately she has quit smoking.  Her brother unexpectedly passed away last week and she had significant urinary symptoms of urgency and  frequency at that time related to stress, but have since resolved.  She really denies any urinary symptoms at this time.  Urinalysis today is completely benign.  She would like to avoid any medications that she has problems swallowing pills and can have significant side effects, previously had bothersome constipation from oxybutynin.  She may be interested in a patch or gel in the future if available.  We opted to start with behavioral strategies, but could insert or a trial of oral Myrbetriq or an oxybutynin patch if overactive symptoms return in.  Nickolas Madrid, MD 12/26/2021

## 2022-01-29 ENCOUNTER — Other Ambulatory Visit: Payer: Self-pay | Admitting: Gerontology

## 2022-01-29 ENCOUNTER — Telehealth: Payer: Self-pay

## 2022-01-29 DIAGNOSIS — Z1231 Encounter for screening mammogram for malignant neoplasm of breast: Secondary | ICD-10-CM

## 2022-01-29 NOTE — Telephone Encounter (Signed)
Pt scheduled for 3 mBCG appts in January. Offered to schedule pt for early December, pt declined stating it would put her in a financial hardship to schedule in December.

## 2022-01-29 NOTE — Telephone Encounter (Signed)
-----   Message from Billey Co, MD sent at 12/26/2021 11:43 AM EDT ----- Regarding: bcg Please set up maintenance BCG x3 for early December, thank you  Nickolas Madrid, MD 12/26/2021

## 2022-03-09 ENCOUNTER — Ambulatory Visit: Payer: 59 | Admitting: Physician Assistant

## 2022-03-09 DIAGNOSIS — C672 Malignant neoplasm of lateral wall of bladder: Secondary | ICD-10-CM

## 2022-03-09 LAB — URINALYSIS, COMPLETE
Bilirubin, UA: NEGATIVE
Glucose, UA: NEGATIVE
Ketones, UA: NEGATIVE
Nitrite, UA: NEGATIVE
Protein,UA: NEGATIVE
RBC, UA: NEGATIVE
Specific Gravity, UA: <1.005 — ABNORMAL LOW (ref 1.005–1.030)
Urobilinogen, Ur: 0.2 mg/dL (ref 0.2–1.0)
pH, UA: 6 (ref 5.0–7.5)

## 2022-03-09 LAB — MICROSCOPIC EXAMINATION: Epithelial Cells (non renal): >10 /hpf — AB (ref 0–10)

## 2022-03-09 MED ORDER — BCG LIVE 50 MG IS SUSR
3.2400 mL | Freq: Once | INTRAVESICAL | Status: AC
  Administered 2022-03-09: 81 mg via INTRAVESICAL

## 2022-03-09 NOTE — Progress Notes (Signed)
BCG Bladder Instillation  BCG # 1 of 3  Due to Bladder Cancer patient is present today for a BCG treatment. Patient was cleaned and prepped in a sterile fashion with betadine. A 14FR catheter was inserted, urine return was noted 84m, urine was yellow in color.  579mof reconstituted BCG was instilled into the bladder. The catheter was then removed. Patient tolerated well, no complications were noted  Performed by: SaDebroah LoopPA-C   Follow up/ Additional notes: 1 week for BCG #2 of 3

## 2022-03-16 ENCOUNTER — Ambulatory Visit: Payer: 59 | Admitting: Physician Assistant

## 2022-03-16 DIAGNOSIS — C672 Malignant neoplasm of lateral wall of bladder: Secondary | ICD-10-CM

## 2022-03-16 LAB — URINALYSIS, COMPLETE
Bilirubin, UA: NEGATIVE
Glucose, UA: NEGATIVE
Ketones, UA: NEGATIVE
Nitrite, UA: NEGATIVE
Protein,UA: NEGATIVE
RBC, UA: NEGATIVE
Specific Gravity, UA: <1.005 — ABNORMAL LOW (ref 1.005–1.030)
Urobilinogen, Ur: 0.2 mg/dL (ref 0.2–1.0)
pH, UA: 5 (ref 5.0–7.5)

## 2022-03-16 LAB — MICROSCOPIC EXAMINATION: Epithelial Cells (non renal): >10 /hpf — AB (ref 0–10)

## 2022-03-16 MED ORDER — BCG LIVE 50 MG IS SUSR
3.2400 mL | Freq: Once | INTRAVESICAL | Status: AC
  Administered 2022-03-16: 81 mg via INTRAVESICAL

## 2022-03-16 NOTE — Progress Notes (Signed)
BCG Bladder Instillation  BCG # 2 of 3  Due to Bladder Cancer patient is present today for a BCG treatment. Patient was cleaned and prepped in a sterile fashion with betadine. A 14FR catheter was inserted, urine return was noted 5m, urine was yellow in color.  526mof reconstituted BCG was instilled into the bladder. The catheter was then removed. Patient tolerated well, no complications were noted  Performed by: SaDebroah LoopPA-C   Follow up/ Additional notes: 1 week for BCG #3 of 3

## 2022-03-23 ENCOUNTER — Ambulatory Visit: Payer: 59 | Admitting: Physician Assistant

## 2022-03-23 DIAGNOSIS — C672 Malignant neoplasm of lateral wall of bladder: Secondary | ICD-10-CM | POA: Diagnosis not present

## 2022-03-23 LAB — URINALYSIS, COMPLETE
Bilirubin, UA: NEGATIVE
Glucose, UA: NEGATIVE
Ketones, UA: NEGATIVE
Nitrite, UA: NEGATIVE
Protein,UA: NEGATIVE
Specific Gravity, UA: <1.005 — ABNORMAL LOW (ref 1.005–1.030)
Urobilinogen, Ur: 0.2 mg/dL (ref 0.2–1.0)
pH, UA: 5.5 (ref 5.0–7.5)

## 2022-03-23 LAB — MICROSCOPIC EXAMINATION

## 2022-03-23 MED ORDER — BCG LIVE 50 MG IS SUSR
3.2400 mL | Freq: Once | INTRAVESICAL | Status: AC
  Administered 2022-03-23: 81 mg via INTRAVESICAL

## 2022-03-23 NOTE — Progress Notes (Signed)
BCG Bladder Instillation  BCG # 3 of 3  Due to Bladder Cancer patient is present today for a BCG treatment. Patient was cleaned and prepped in a sterile fashion with betadine. A 14FR catheter was inserted, urine return was noted 22m, urine was yello in color.  530mof reconstituted BCG was instilled into the bladder. The catheter was then removed. Patient tolerated well, no complications were noted  Performed by: SaDebroah LoopPA-C and AnTiffany KocherCMA  Follow up/ Additional notes: TBD cysto timing, will contact patient

## 2022-03-27 LAB — CULTURE, URINE COMPREHENSIVE

## 2022-03-30 ENCOUNTER — Telehealth: Payer: Self-pay | Admitting: *Deleted

## 2022-03-30 NOTE — Telephone Encounter (Signed)
.  left message to have patient return my call. Appt rescheduled   Debroah Loop, PA-C  Edwin Dada L, CMA Can you please reschedule her cysto to 6 weeks after her most recent bcg and notify the patient?

## 2022-04-03 ENCOUNTER — Other Ambulatory Visit: Payer: 59 | Admitting: Urology

## 2022-05-01 ENCOUNTER — Ambulatory Visit: Payer: 59 | Admitting: Urology

## 2022-05-01 ENCOUNTER — Other Ambulatory Visit
Admission: RE | Admit: 2022-05-01 | Discharge: 2022-05-01 | Disposition: A | Discharge status: Home / Self Care | Payer: 59 | Source: Ambulatory Visit | Attending: Urology | Admitting: Urology

## 2022-05-01 ENCOUNTER — Encounter: Payer: Self-pay | Admitting: Urology

## 2022-05-01 ENCOUNTER — Other Ambulatory Visit: Payer: Self-pay

## 2022-05-01 VITALS — BP 176/89 | HR 79 | Ht 61.0 in | Wt 229.0 lb

## 2022-05-01 DIAGNOSIS — Z8551 Personal history of malignant neoplasm of bladder: Secondary | ICD-10-CM | POA: Diagnosis not present

## 2022-05-01 MED ORDER — LIDOCAINE HCL URETHRAL/MUCOSAL 2 % EX GEL
1.0000 | Freq: Once | CUTANEOUS | Status: AC
  Administered 2022-05-01: 1 via URETHRAL

## 2022-05-01 NOTE — Progress Notes (Signed)
non

## 2022-05-01 NOTE — Progress Notes (Signed)
Bladder cancer surveillance note  INDICATION CIS  UROLOGIC HISTORY 62 year old female who originally presented with irritative urinary symptoms, normal CT urogram, and cystoscopy showing at least 5 cm of erythematous bladder tissue, TURBT on 12/30/2020 showed CIS as well as some early papillary change with possible focal lamina propria invasion.  Her irritative urinary symptoms have essentially resolved after TURBT and no longer taking oxybutynin.  She had a recurrence in February 2023 with erythema at the posterior bladder wall and suspicious cytology, and underwent bladder biopsy and fulguration with gemcitabine on 08/04/2021 with pathology showing CIS.  Initial Diagnosis of Bladder  Date: 12/2020 Pathology: CIS, early papillary change with possible focal lamina propria invasion  Recurrent Bladder Cancer Diagnosis  Date  Pathology  08/2021 CIS  Second round of induction BCG completed 11/2021 mBCG 03/2022   AUA Risk Category High  Cystoscopy Procedure Note:  After informed consent and discussion of the procedure and its risks, Heather Nguyen was positioned and prepped in the standard fashion. Cystoscopy was performed with the a flexible cystoscope. The urethra, bladder neck and entire bladder was visualized in a standard fashion. The ureteral orifices were visualized in their normal location and orientation.  There was some very subtle erythema at prior resection site at the bladder base, but no suspicious lesions, no abnormalities on retroflexion.  -Follow-up cytology, if atypical or negative schedule mBCG for April 2024, then October 2024 if no evidence of recurrence -She denies any overactive symptoms or dysuria today   Nickolas Madrid, MD 05/01/2022

## 2022-05-03 LAB — CYTOLOGY - NON PAP

## 2022-05-04 ENCOUNTER — Other Ambulatory Visit: Payer: Self-pay | Admitting: Urology

## 2022-05-04 ENCOUNTER — Telehealth: Payer: Self-pay

## 2022-05-04 NOTE — Telephone Encounter (Signed)
-----   Message from Billey Co, MD sent at 05/04/2022  7:11 AM EST ----- Unfortunately urine cytology does show some abnormal cells that warrant cysto and biopsy in OR. Orders sent to [facility]. Ok to schedule in person or virtual if further questions  Nickolas Madrid, MD 05/04/2022

## 2022-05-04 NOTE — Progress Notes (Unsigned)
Surgical Physician Order [facility] will call with path 3/1- can wait to call to schedule until next week  * Scheduling expectation : Next Available  *Length of Case: 30 mins  *Clearance needed: no  *Anticoagulation Instructions: Hold all anticoagulants  *Aspirin Instructions: Hold Aspirin  *Post-op visit Date/Instructions:  2 weeks discuss path  *Diagnosis: Bladder Lesion  *Procedure:     Cysto Bladder Biopsy [id number])   Additional orders: Gemcitabine '2000mg'$  bladder instillation  -Admit type: OUTpatient  -Anesthesia: General  -VTE Prophylaxis Standing Order SCD's       Other:   -Standing Lab Orders Per Anesthesia    Lab other: UA&Urine Culture  -Standing Test orders EKG/Chest x-ray per Anesthesia       Test other:   - Medications:  Ancef 2gm IV  -Other orders:  N/A

## 2022-05-04 NOTE — Telephone Encounter (Signed)
Called pt informed her of the information below. Pt voiced understanding and states she will "have to put this off for a while" as she is caring for her husband who has cancer. Informed pt of the seriousness of bx, she voiced understanding.

## 2022-05-07 NOTE — Progress Notes (Signed)
Patient states that she financially cannot afford to go back to the OR, would like to know alternative treatment or procedures. Message sent to Dr. Diamantina Providence. Removing from OR coordinator basket will address again if patient is ready to go to OR.

## 2022-05-08 ENCOUNTER — Other Ambulatory Visit: Payer: Self-pay

## 2022-05-08 DIAGNOSIS — Z8551 Personal history of malignant neoplasm of bladder: Secondary | ICD-10-CM

## 2022-05-21 ENCOUNTER — Ambulatory Visit
Admission: RE | Admit: 2022-05-21 | Discharge: 2022-05-21 | Disposition: A | Discharge status: Home / Self Care | Payer: 59 | Source: Ambulatory Visit | Attending: Gerontology | Admitting: Gerontology

## 2022-05-21 DIAGNOSIS — Z1231 Encounter for screening mammogram for malignant neoplasm of breast: Secondary | ICD-10-CM | POA: Diagnosis not present

## 2022-06-11 ENCOUNTER — Other Ambulatory Visit: Payer: Self-pay | Admitting: Urology

## 2022-06-11 ENCOUNTER — Other Ambulatory Visit
Admission: RE | Admit: 2022-06-11 | Discharge: 2022-06-11 | Disposition: A | Discharge status: Home / Self Care | Payer: 59 | Attending: Urology | Admitting: Urology

## 2022-06-14 LAB — CYTOLOGY - NON PAP

## 2022-06-26 ENCOUNTER — Encounter: Payer: Self-pay | Admitting: Urology

## 2022-06-26 ENCOUNTER — Ambulatory Visit: Payer: 59 | Admitting: Urology

## 2022-06-26 VITALS — BP 172/84 | HR 77 | Ht 62.0 in | Wt 222.0 lb

## 2022-06-26 DIAGNOSIS — C678 Malignant neoplasm of overlapping sites of bladder: Secondary | ICD-10-CM | POA: Diagnosis not present

## 2022-06-26 NOTE — Progress Notes (Signed)
   06/26/2022 9:29 AM   Heather Nguyen 1960-10-23 [id number]  Reason for visit: Follow up bladder cancer  HPI: 62 year old former smoker who originally presented with irritative urinary symptoms, normal CT urogram, and cystoscopy showing at least 5 cm of erythematous bladder tissue with TURBT 12/30/2020 showing CIS as well as some early papillary change with possible focal lamina propria invasion.  Her symptoms resolved after TURBT.  She initiated treatment with BCG, but had a recurrence of CIS in February 2023 with erythema at the posterior bladder wall and suspicious cytology, and resumed BCG with second round of induction completed September 2023.  She has completely quit smoking.  She has had a number of financial concerns throughout her treatments.  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  She declined surgery secondary to financial concerns.  She was ultimately amenable to a repeat cytology.  Repeat voided cytology on 06/11/2022 showed atypical urothelial cells, she continues to deny any irritative urinary symptoms.  We had a long conversation today about the AUA guidelines regarding management of bladder cancer, and risk of progression and developing metastatic disease by deferring biopsy or TURBT.  I think she has a good understanding of these risks.  I again recommended biopsy/TURBT and resuming BCG with her slightly abnormal cystoscopy and positive cytology.  She again deferred surgical intervention at this time, but was amenable to continuing BCG with close surveillance cystoscopy.  We again discussed at length that this is off the typical treatment plan for bladder cancer, and she is increasing her risk of progression or developing metastatic disease by  deferring definitive treatment.  Maintenance BCG ASAP, RTC for cystoscopy 2 to 3 months  Sondra Come, MD  [facility] [address], [address] [place], [place] [address] [phone]

## 2022-07-10 ENCOUNTER — Ambulatory Visit: Payer: 59 | Admitting: Physician Assistant

## 2022-07-10 ENCOUNTER — Encounter: Payer: Self-pay | Admitting: Physician Assistant

## 2022-07-10 DIAGNOSIS — C678 Malignant neoplasm of overlapping sites of bladder: Secondary | ICD-10-CM | POA: Diagnosis not present

## 2022-07-10 LAB — URINALYSIS, COMPLETE
Bilirubin, UA: NEGATIVE
Glucose, UA: NEGATIVE
Ketones, UA: NEGATIVE
Leukocytes,UA: NEGATIVE
Nitrite, UA: NEGATIVE
Protein,UA: NEGATIVE
RBC, UA: NEGATIVE
Specific Gravity, UA: 1.025 (ref 1.005–1.030)
Urobilinogen, Ur: 0.2 mg/dL (ref 0.2–1.0)
pH, UA: 5.5 (ref 5.0–7.5)

## 2022-07-10 LAB — MICROSCOPIC EXAMINATION: Epithelial Cells (non renal): >10 /hpf — AB (ref 0–10)

## 2022-07-10 MED ORDER — BCG LIVE 50 MG IS SUSR
3.2400 mL | Freq: Once | INTRAVESICAL | Status: AC
  Administered 2022-07-10: 81 mg via INTRAVESICAL

## 2022-07-10 NOTE — Progress Notes (Addendum)
BCG Bladder Instillation  BCG # 1/3  Due to Bladder Cancer patient is present today for a BCG treatment. Patient was cleaned and prepped in a sterile fashion with betadine. A 16FR catheter was inserted, urine return was noted 25 ml, urine was yellow in color.  50ml of reconstituted BCG was instilled into the bladder. The catheter was then removed. Patient tolerated well, no complications were noted  Performed by: August Luz, RN and Carman Ching, PA-C   Follow up/ Additional notes: 1 week

## 2022-07-17 ENCOUNTER — Ambulatory Visit: Payer: 59 | Admitting: Physician Assistant

## 2022-07-17 VITALS — BP 137/85 | HR 82 | Ht 62.0 in | Wt 222.0 lb

## 2022-07-17 DIAGNOSIS — C678 Malignant neoplasm of overlapping sites of bladder: Secondary | ICD-10-CM

## 2022-07-17 LAB — URINALYSIS, COMPLETE
Bilirubin, UA: NEGATIVE
Glucose, UA: NEGATIVE
Ketones, UA: NEGATIVE
Nitrite, UA: NEGATIVE
Protein,UA: NEGATIVE
RBC, UA: NEGATIVE
Specific Gravity, UA: 1.02 (ref 1.005–1.030)
Urobilinogen, Ur: 0.2 mg/dL (ref 0.2–1.0)
pH, UA: 5 (ref 5.0–7.5)

## 2022-07-17 LAB — MICROSCOPIC EXAMINATION: Epithelial Cells (non renal): >10 /hpf — AB (ref 0–10)

## 2022-07-17 MED ORDER — BCG LIVE 50 MG IS SUSR
3.2400 mL | Freq: Once | INTRAVESICAL | Status: AC
  Administered 2022-07-17: 81 mg via INTRAVESICAL

## 2022-07-17 NOTE — Progress Notes (Signed)
BCG Bladder Instillation  BCG # 2 of 3  Due to Bladder Cancer patient is present today for a BCG treatment. Patient was cleaned and prepped in a sterile fashion with betadine. A 14FR catheter was inserted, urine return was noted 50ml, urine was yellow in color.  50ml of reconstituted BCG was instilled into the bladder. The catheter was then removed. Patient tolerated well, no complications were noted  Performed by: Carman Ching, PA-C and Humberta Magallon-Mariche, CMA  Follow up/ Additional notes: 1 week for BCG #3

## 2022-07-24 ENCOUNTER — Encounter: Payer: Self-pay | Admitting: Physician Assistant

## 2022-07-24 ENCOUNTER — Ambulatory Visit: Payer: 59 | Admitting: Physician Assistant

## 2022-07-24 DIAGNOSIS — C678 Malignant neoplasm of overlapping sites of bladder: Secondary | ICD-10-CM | POA: Diagnosis not present

## 2022-07-24 LAB — URINALYSIS, COMPLETE
Bilirubin, UA: NEGATIVE
Glucose, UA: NEGATIVE
Ketones, UA: NEGATIVE
Nitrite, UA: NEGATIVE
Protein,UA: NEGATIVE
RBC, UA: NEGATIVE
Specific Gravity, UA: 1.02 (ref 1.005–1.030)
Urobilinogen, Ur: 0.2 mg/dL (ref 0.2–1.0)
pH, UA: 7 (ref 5.0–7.5)

## 2022-07-24 LAB — MICROSCOPIC EXAMINATION

## 2022-07-24 MED ORDER — BCG LIVE 50 MG IS SUSR
3.2400 mL | Freq: Once | INTRAVESICAL | Status: AC
  Administered 2022-07-24: 81 mg via INTRAVESICAL

## 2022-07-24 NOTE — Progress Notes (Signed)
BCG Bladder Instillation  BCG # 3 of 3  Due to Bladder Cancer patient is present today for a BCG treatment. Patient was cleaned and prepped in a sterile fashion with betadine. A 14FR catheter was inserted, urine return was noted 25ml, urine was yellow in color.  50ml of reconstituted BCG was instilled into the bladder. The catheter was then removed. Patient tolerated well, no complications were noted  Performed by: Carman Ching, PA-C and Wynona Dove, RN  Additional notes: UA with WBCs, bacteria today, not uncommon for her. She denies infective sx. Will send for culture in case she becomes symptomatic but proceed with treatment as scheduled.  Follow up: 2 months for cysto with Dr. Richardo Hanks

## 2022-07-27 LAB — CULTURE, URINE COMPREHENSIVE

## 2022-09-26 ENCOUNTER — Other Ambulatory Visit: Payer: 59 | Admitting: Urology

## 2022-10-16 ENCOUNTER — Encounter: Payer: Self-pay | Admitting: Urology

## 2022-10-16 ENCOUNTER — Ambulatory Visit: Payer: 59 | Admitting: Urology

## 2022-10-16 VITALS — BP 161/83 | HR 85 | Ht 62.0 in | Wt 222.0 lb

## 2022-10-16 DIAGNOSIS — C678 Malignant neoplasm of overlapping sites of bladder: Secondary | ICD-10-CM

## 2022-10-16 DIAGNOSIS — Z8551 Personal history of malignant neoplasm of bladder: Secondary | ICD-10-CM | POA: Diagnosis not present

## 2022-10-16 NOTE — Progress Notes (Signed)
Bladder cancer surveillance note  INDICATION CIS  UROLOGIC HISTORY 62 year old female who originally presented with irritative urinary symptoms, normal CT urogram, and cystoscopy showing at least 5 cm of erythematous bladder tissue, TURBT on 12/30/2020 showed CIS as well as some early papillary change with possible focal lamina propria invasion.  Her irritative urinary symptoms resolved after TURBT and no longer taking oxybutynin.  She had a recurrence in February 2023 with erythema at the posterior bladder wall and suspicious cytology, and underwent bladder biopsy and fulguration with gemcitabine on 08/04/2021 with pathology showing CIS.  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  I recommended bladder biopsy and fulguration/TURBT/gemcitabine, but she deferred secondary to financial concerns.  She was amenable to a repeat cytology, and repeat voided cytology in April 2024 showed atypical cells and she continued to defer surgery, but ultimately opted to continue with maintenance BCG.  Risk and benefits have been discussed extensively of deferring intervention with her positive cytology.  Initial Diagnosis of Bladder  Date: 12/2020 Pathology: CIS, early papillary change with possible focal lamina propria invasion  Recurrent Bladder Cancer Diagnosis  Date  Pathology  08/2021 CIS  Second round of induction BCG completed 11/2021 mBCG 03/2022 mBCG 07/2022   AUA Risk Category High  Cystoscopy Procedure Note:  After informed consent and discussion of the procedure and its risks, Heather Nguyen was positioned and prepped in the standard fashion. Cystoscopy was performed with the a flexible cystoscope. The urethra, bladder neck and entire bladder was visualized in a standard fashion. The ureteral orifices were visualized in their normal location and orientation.  Bladder  mucosa overall normal, very minimal subtle erythema adjacent to prior resection site.  No abnormalities on retroflexion.  -Follow-up cytology, if positive would again strongly recommend OR for biopsy/TURBT/gemcitabine -If cytology negative/atypical can continue mBCG, next treatment due November 2024, would follow with cystoscopy in December 2024 -She denies any overactive symptoms or dysuria today, she has quit smoking   Legrand Rams, MD 10/16/2022

## 2022-10-30 ENCOUNTER — Other Ambulatory Visit: Payer: 59 | Admitting: Urology

## 2023-01-07 ENCOUNTER — Telehealth: Payer: Self-pay | Admitting: *Deleted

## 2023-01-07 NOTE — Telephone Encounter (Signed)
Notified patient as instructed, patient pleased. Discussed follow-up BCG patient agrees

## 2023-01-07 NOTE — Telephone Encounter (Signed)
   Provider- Dr. Richardo Hanks    Procedure OVFI:[id number]  Drug JJOA:[id number]    Urothelial Carcinoma of Bladder -  C67.9  Expected date of instillation _______11/2024_________    Below to be completed by staff member contacting insurance.   BCG verification completed- Yes  Pa needed- No  Insurance contacted - Pa initiated/ complete         Auth number:________Ref, [id number]_____________  Approval dates : ___Not needed. _______________   Denied:_________________________    BCG scheduled:____11/19/2024 __________________________________   Pt aware and instructions given.   JQ aware to order Bcg.

## 2023-01-22 ENCOUNTER — Ambulatory Visit: Payer: 59 | Admitting: Physician Assistant

## 2023-01-29 ENCOUNTER — Ambulatory Visit: Payer: 59 | Admitting: Urology

## 2023-02-05 ENCOUNTER — Ambulatory Visit: Payer: 59 | Admitting: Physician Assistant

## 2023-02-19 ENCOUNTER — Telehealth: Payer: Self-pay | Admitting: Urology

## 2023-02-19 NOTE — Telephone Encounter (Signed)
Patient called requesting results from last procedure appt in August. She said she didn't see anything in mychart that she can download and print. She stated that she keeps a folder with all her results. Please advise patient.

## 2023-02-19 NOTE — Telephone Encounter (Signed)
Called pt advised her that she was given results of her cystoscopy the day of her procedure, and she was given the results of her cytology via phone. Advised that notes and results should automatically release via Epic and we are not stopping that process in any way nor control it. Advised pt that I would be happy to mail results to her. Pt voiced understanding. Results mailed.

## 2023-02-20 ENCOUNTER — Other Ambulatory Visit: Payer: Self-pay | Admitting: Gerontology

## 2023-02-20 DIAGNOSIS — Z1231 Encounter for screening mammogram for malignant neoplasm of breast: Secondary | ICD-10-CM

## 2023-03-25 ENCOUNTER — Ambulatory Visit: Payer: 59 | Admitting: Physician Assistant

## 2023-04-02 ENCOUNTER — Ambulatory Visit: Payer: 59 | Admitting: Urology

## 2023-04-06 ENCOUNTER — Telehealth: Payer: Self-pay | Admitting: Pharmacy Technician

## 2023-04-06 NOTE — Telephone Encounter (Signed)
Auth Submission: NO AUTH NEEDED Site of care: [facility] [place] Payer: [employer] Medication & CPT/J Code(s) submitted:  BCG J9030 Route of submission (phone, fax, portal): PORTAL Phone # Fax # Auth type: Buy/Bill PB Units/visits requested:  Reference number: [id number] Approval from: 04/06/23 to 03/04/24      Auth request has been scanned to media tab No auth needed

## 2023-04-08 ENCOUNTER — Ambulatory Visit: Payer: 59 | Admitting: Physician Assistant

## 2023-04-08 VITALS — BP 139/85 | HR 79 | Ht 61.0 in | Wt 220.0 lb

## 2023-04-08 DIAGNOSIS — C678 Malignant neoplasm of overlapping sites of bladder: Secondary | ICD-10-CM | POA: Diagnosis not present

## 2023-04-08 LAB — URINALYSIS, COMPLETE
Bilirubin, UA: NEGATIVE
Glucose, UA: NEGATIVE
Ketones, UA: NEGATIVE
Nitrite, UA: NEGATIVE
Protein,UA: NEGATIVE
RBC, UA: NEGATIVE
Specific Gravity, UA: <1.005 — ABNORMAL LOW (ref 1.005–1.030)
Urobilinogen, Ur: 0.2 mg/dL (ref 0.2–1.0)
pH, UA: 5.5 (ref 5.0–7.5)

## 2023-04-08 LAB — MICROSCOPIC EXAMINATION
Epithelial Cells (non renal): >10 /[HPF] — AB (ref 0–10)
RBC, Urine: NONE SEEN /[HPF] (ref 0–2)

## 2023-04-08 MED ORDER — BCG LIVE 50 MG IS SUSR
3.2400 mL | Freq: Once | INTRAVESICAL | Status: AC
  Administered 2023-04-08: 81 mg via INTRAVESICAL

## 2023-04-08 NOTE — Progress Notes (Signed)
BCG Bladder Instillation  BCG # 1 of 3  Due to Bladder Cancer patient is present today for a BCG treatment. Patient was cleaned and prepped in a sterile fashion with betadine. A 14FR catheter was inserted, urine return was noted , urine was yellow in color.  50ml of reconstituted BCG was instilled into the bladder. The catheter was then removed. Patient tolerated well, no complications were noted  Performed by: Carman Ching, PA-C and Domingo Cocking, CMA  Additional notes: She was originally scheduled for mBCG in November, but rescheduled these due to illness.  Follow up: 1 week

## 2023-04-16 ENCOUNTER — Ambulatory Visit: Payer: 59 | Admitting: Physician Assistant

## 2023-04-22 NOTE — Progress Notes (Unsigned)
BCG Bladder Instillation  Patient presented for number 2 out of 3 BCG's, but she stated that she was experiencing chills, dysuria and cloudy urine.  UA yellow clear, specific gravity 1.010, ph 5.5, 1+ leukocyte, 11-30 WBCs, 0-2 RBCs, greater than 10 epithelial cells, mucus threads present and many bacteria.  Urine will be sent for culture.  We will hold on prescribing an antibiotic until culture results are available.  We have added another BCG treatment onto the end of her cycle.

## 2023-04-23 ENCOUNTER — Ambulatory Visit: Payer: 59 | Admitting: Urology

## 2023-04-23 VITALS — BP 159/72 | HR 77 | Ht 62.0 in | Wt 210.0 lb

## 2023-04-23 DIAGNOSIS — R300 Dysuria: Secondary | ICD-10-CM | POA: Diagnosis not present

## 2023-04-23 DIAGNOSIS — C678 Malignant neoplasm of overlapping sites of bladder: Secondary | ICD-10-CM

## 2023-04-23 LAB — URINALYSIS, COMPLETE
Bilirubin, UA: NEGATIVE
Glucose, UA: NEGATIVE
Ketones, UA: NEGATIVE
Nitrite, UA: NEGATIVE
Protein,UA: NEGATIVE
RBC, UA: NEGATIVE
Specific Gravity, UA: 1.01 (ref 1.005–1.030)
Urobilinogen, Ur: 0.2 mg/dL (ref 0.2–1.0)
pH, UA: 5.5 (ref 5.0–7.5)

## 2023-04-23 LAB — MICROSCOPIC EXAMINATION: Epithelial Cells (non renal): >10 /[HPF] — AB (ref 0–10)

## 2023-04-23 MED ORDER — BCG LIVE 50 MG IS SUSR
3.2400 mL | Freq: Once | INTRAVESICAL | Status: AC
  Administered 2023-04-23: 81 mg via INTRAVESICAL

## 2023-04-26 LAB — CULTURE, URINE COMPREHENSIVE

## 2023-04-30 ENCOUNTER — Ambulatory Visit: Payer: 59 | Admitting: Physician Assistant

## 2023-05-07 ENCOUNTER — Ambulatory Visit: Payer: 59 | Admitting: Physician Assistant

## 2023-05-08 ENCOUNTER — Encounter: Payer: Self-pay | Admitting: Physician Assistant

## 2023-05-14 ENCOUNTER — Ambulatory Visit: Payer: 59 | Admitting: Physician Assistant

## 2023-05-22 ENCOUNTER — Ambulatory Visit: Admitting: Physician Assistant

## 2023-05-22 VITALS — BP 145/74 | HR 79

## 2023-05-22 DIAGNOSIS — C678 Malignant neoplasm of overlapping sites of bladder: Secondary | ICD-10-CM | POA: Diagnosis not present

## 2023-05-22 LAB — URINALYSIS, ROUTINE W REFLEX MICROSCOPIC
Bilirubin, UA: NEGATIVE
Glucose, UA: NEGATIVE
Ketones, UA: NEGATIVE
Nitrite, UA: NEGATIVE
Protein,UA: NEGATIVE
Specific Gravity, UA: 1.01 (ref 1.005–1.030)
Urobilinogen, Ur: 0.2 mg/dL (ref 0.2–1.0)
pH, UA: 7 (ref 5.0–7.5)

## 2023-05-22 LAB — MICROSCOPIC EXAMINATION

## 2023-05-22 MED ORDER — BCG LIVE 50 MG IS SUSR
3.2400 mL | Freq: Once | INTRAVESICAL | Status: AC
  Administered 2023-05-22: 81 mg via INTRAVESICAL

## 2023-05-22 NOTE — Progress Notes (Signed)
 BCG Bladder Instillation  BCG # 2 of 3  Due to Bladder Cancer patient is present today for a BCG treatment. Patient was cleaned and prepped in a sterile fashion with betadine. A 14FR catheter was inserted, urine return was noted 25ml, urine was yellow in color.  50ml of reconstituted BCG was instilled into the bladder. The catheter was then removed. Patient tolerated well, no complications were noted  Performed by: Carman Ching, PA-C and Christoper Fabian, CMA  Follow up/ Additional notes: 1 week

## 2023-05-30 ENCOUNTER — Ambulatory Visit: Admitting: Physician Assistant

## 2023-05-30 VITALS — BP 139/76 | HR 121 | Ht 62.0 in | Wt 216.4 lb

## 2023-05-30 DIAGNOSIS — C672 Malignant neoplasm of lateral wall of bladder: Secondary | ICD-10-CM | POA: Diagnosis not present

## 2023-05-30 LAB — MICROSCOPIC EXAMINATION: Epithelial Cells (non renal): >10 /HPF — AB (ref 0–10)

## 2023-05-30 LAB — URINALYSIS, COMPLETE
Bilirubin, UA: NEGATIVE
Glucose, UA: NEGATIVE
Ketones, UA: NEGATIVE
Nitrite, UA: NEGATIVE
Protein,UA: NEGATIVE
Specific Gravity, UA: 1.02 (ref 1.005–1.030)
Urobilinogen, Ur: 0.2 mg/dL (ref 0.2–1.0)
pH, UA: 5 (ref 5.0–7.5)

## 2023-05-30 MED ORDER — DOXYCYCLINE HYCLATE 100 MG PO CAPS: 100.0000 mg | ORAL_CAPSULE | Freq: Two times a day (BID) | ORAL | 0 refills | Status: AC

## 2023-05-30 NOTE — Progress Notes (Signed)
 Patient presented to the clinic today for a scheduled BCG instillation.  Urine with increased pyuria and bacteria compared to prior, culture pending.  Patient reports frequency, urgency.  Will treat for acute UTI today.  Prescription for Doxy 100mg  BID x7 days sent to pharmacy.  Patient to return to clinic next week for next scheduled BCG treatment.  We will add an additional treatment to her scheduled series to make up for today's missed instillation.  Carman Ching, PA-C 05/30/23 9:46 AM

## 2023-06-03 LAB — CULTURE, URINE COMPREHENSIVE

## 2023-06-06 ENCOUNTER — Ambulatory Visit: Admitting: Physician Assistant

## 2023-06-06 VITALS — BP 161/81 | HR 98

## 2023-06-06 DIAGNOSIS — C672 Malignant neoplasm of lateral wall of bladder: Secondary | ICD-10-CM | POA: Diagnosis not present

## 2023-06-06 LAB — MICROSCOPIC EXAMINATION
Epithelial Cells (non renal): >10 /HPF — AB (ref 0–10)
WBC, UA: >30 /HPF — AB (ref 0–5)

## 2023-06-06 LAB — URINALYSIS, COMPLETE
Bilirubin, UA: NEGATIVE
Glucose, UA: NEGATIVE
Ketones, UA: NEGATIVE
Nitrite, UA: NEGATIVE
Protein,UA: NEGATIVE
RBC, UA: NEGATIVE
Specific Gravity, UA: 1.015 (ref 1.005–1.030)
Urobilinogen, Ur: 0.2 mg/dL (ref 0.2–1.0)
pH, UA: 5.5 (ref 5.0–7.5)

## 2023-06-06 MED ORDER — BCG LIVE 50 MG IS SUSR
3.2400 mL | Freq: Once | INTRAVESICAL | Status: AC
  Administered 2023-06-06: 81 mg via INTRAVESICAL

## 2023-06-06 NOTE — Progress Notes (Signed)
 BCG Bladder Instillation  BCG # 3 of 3  Due to Bladder Cancer patient is present today for a BCG treatment. Patient was cleaned and prepped in a sterile fashion with betadine. A 14FR catheter was inserted, urine return was noted , urine was yellow in color.  50ml of reconstituted BCG was instilled into the bladder. The catheter was then removed. Patient tolerated well, no complications were noted  Performed by: Carman Ching, PA-C and Humberta Magallon-Mariche, CMA  Follow up: Return in about 6 weeks (around 07/18/2023) for Cysto with Dr. Richardo Hanks.

## 2023-06-26 ENCOUNTER — Ambulatory Visit
Admission: RE | Admit: 2023-06-26 | Discharge: 2023-06-26 | Disposition: A | Discharge status: Home / Self Care | Source: Ambulatory Visit | Attending: Gerontology | Admitting: Gerontology

## 2023-06-26 DIAGNOSIS — Z1231 Encounter for screening mammogram for malignant neoplasm of breast: Secondary | ICD-10-CM | POA: Insufficient documentation

## 2023-07-15 ENCOUNTER — Encounter: Payer: Self-pay | Admitting: Physician Assistant

## 2023-07-17 ENCOUNTER — Other Ambulatory Visit: Admitting: Urology

## 2023-07-30 ENCOUNTER — Other Ambulatory Visit: Admitting: Urology

## 2023-08-13 ENCOUNTER — Ambulatory Visit: Admitting: Urology

## 2023-08-13 VITALS — BP 157/85 | HR 83 | Ht 62.0 in | Wt 227.2 lb

## 2023-08-13 DIAGNOSIS — Z8551 Personal history of malignant neoplasm of bladder: Secondary | ICD-10-CM

## 2023-08-13 MED ORDER — LIDOCAINE HCL URETHRAL/MUCOSAL 2 % EX GEL
1.0000 | Freq: Once | CUTANEOUS | Status: AC
  Administered 2023-08-13: 1 via URETHRAL

## 2023-08-13 MED ORDER — CEPHALEXIN 250 MG PO CAPS
500.0000 mg | ORAL_CAPSULE | Freq: Once | ORAL | Status: AC
  Administered 2023-08-13: 500 mg via ORAL

## 2023-08-13 NOTE — Progress Notes (Signed)
 Bladder cancer surveillance note  INDICATION CIS  UROLOGIC HISTORY 63 year old female who originally presented with irritative urinary symptoms, normal CT urogram, and cystoscopy showing at least 5 cm of erythematous bladder tissue, TURBT on 12/30/2020 showed CIS as well as some early papillary change with possible focal lamina propria invasion.  Her irritative urinary symptoms resolved after TURBT and no longer taking oxybutynin .  She had a recurrence in February 2023 with erythema at the posterior bladder wall and suspicious cytology, and underwent bladder biopsy and fulguration with gemcitabine  on 08/04/2021 with pathology showing CIS.  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  I recommended bladder biopsy and fulguration/TURBT/gemcitabine , but she deferred secondary to financial concerns.  She was amenable to a repeat cytology, and repeat voided cytology in April 2024 showed atypical cells and she continued to defer surgery, but ultimately opted to continue with maintenance BCG.  Risk and benefits have been discussed extensively of deferring intervention with her positive cytology.  Initial Diagnosis of Bladder  Date: 12/2020 Pathology: CIS, early papillary change with possible focal lamina propria invasion  Recurrent Bladder Cancer Diagnosis  Date  Pathology  08/2021 CIS  Second round of induction BCG completed 11/2021 mBCG 03/2022 mBCG 07/2022 mBCG 05/2023   AUA Risk Category High  Cystoscopy Procedure Note:  After informed consent and discussion of the procedure and its risks, Heather Nguyen was positioned and prepped in the standard fashion. Cystoscopy was performed with the a flexible cystoscope. The urethra, bladder neck and entire bladder was visualized in a standard fashion. The ureteral orifices were visualized in their normal location and  orientation.  Bladder mucosa overall normal, very minimal subtle erythema adjacent to prior resection site.  No abnormalities on retroflexion. Cytology sent.  -Follow-up cytology, if positive would again strongly recommend OR for biopsy/TURBT/gemcitabine  -If cytology negative/atypical can continue mBCG, next treatment due September 2025 -She denies any overactive symptoms or dysuria today, she has quit smoking   Jay Meth, MD 08/13/2023

## 2023-09-04 ENCOUNTER — Encounter: Payer: Self-pay | Admitting: Gerontology

## 2023-09-05 ENCOUNTER — Other Ambulatory Visit: Payer: Self-pay | Admitting: Gerontology

## 2023-09-05 DIAGNOSIS — R2232 Localized swelling, mass and lump, left upper limb: Secondary | ICD-10-CM

## 2023-09-16 ENCOUNTER — Ambulatory Visit
Admission: RE | Admit: 2023-09-16 | Discharge: 2023-09-16 | Disposition: A | Discharge status: Home / Self Care | Source: Ambulatory Visit | Attending: Gerontology | Admitting: Gerontology

## 2023-09-16 DIAGNOSIS — R2232 Localized swelling, mass and lump, left upper limb: Secondary | ICD-10-CM | POA: Diagnosis present

## 2023-11-13 ENCOUNTER — Telehealth: Payer: Self-pay | Admitting: Urology

## 2023-11-13 NOTE — Telephone Encounter (Signed)
 Called pt to inquire why she wishes to d/c BCG treatments. She states that after receiving BCG she feels bad, has significant abdominal discomfort and fatigue. Strongly encouraged patient to keep BCG treatments as scheduled, however she is adamant that I cancel her treatment appointments. Again voiced the benefits and risk of reoccurrence. Patient still wishes to cancel. She would like to keep cysto appt for October 2025.

## 2023-11-13 NOTE — Telephone Encounter (Signed)
 Pt called and stated they would like to stop there treatment but would like Dr. Francisca to still check her often. Pt would like a call back with clarification

## 2023-11-20 ENCOUNTER — Ambulatory Visit: Admitting: Physician Assistant

## 2023-11-27 ENCOUNTER — Ambulatory Visit: Admitting: Physician Assistant

## 2023-12-04 ENCOUNTER — Ambulatory Visit: Admitting: Physician Assistant

## 2023-12-11 ENCOUNTER — Other Ambulatory Visit: Admitting: Urology

## 2024-01-29 ENCOUNTER — Other Ambulatory Visit: Admitting: Urology

## 2024-02-11 ENCOUNTER — Encounter: Payer: Self-pay | Admitting: Urology

## 2024-02-11 ENCOUNTER — Other Ambulatory Visit: Admitting: Urology

## 2024-02-19 ENCOUNTER — Other Ambulatory Visit: Payer: Self-pay | Admitting: Gerontology

## 2024-02-19 DIAGNOSIS — Z1231 Encounter for screening mammogram for malignant neoplasm of breast: Secondary | ICD-10-CM

## 2024-03-19 ENCOUNTER — Ambulatory Visit: Admitting: Urology

## 2024-03-19 VITALS — BP 169/92 | HR 95

## 2024-03-19 DIAGNOSIS — Z8551 Personal history of malignant neoplasm of bladder: Secondary | ICD-10-CM | POA: Diagnosis not present

## 2024-03-19 MED ORDER — CEPHALEXIN 500 MG PO CAPS
500.0000 mg | ORAL_CAPSULE | Freq: Once | ORAL | Status: AC
  Administered 2024-03-19: 500 mg via ORAL

## 2024-03-19 MED ORDER — LIDOCAINE HCL URETHRAL/MUCOSAL 2 % EX GEL
1.0000 | Freq: Once | CUTANEOUS | Status: AC
  Administered 2024-03-19: 1 via URETHRAL

## 2024-03-19 NOTE — Progress Notes (Signed)
 Bladder cancer surveillance note  INDICATION CIS  UROLOGIC HISTORY 64 year old female who originally presented with irritative urinary symptoms, normal CT urogram, and cystoscopy showing at least 5 cm of erythematous bladder tissue, TURBT on 12/30/2020 showed CIS as well as some early papillary change with possible focal lamina propria invasion.  Her irritative urinary symptoms resolved after TURBT and no longer taking oxybutynin .  She had a recurrence in February 2023 with erythema at the posterior bladder wall and suspicious cytology, and underwent bladder biopsy and fulguration with gemcitabine  on 08/04/2021 with pathology showing CIS.  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  I recommended bladder biopsy and fulguration/TURBT/gemcitabine , but she deferred secondary to financial concerns.  She was amenable to a repeat cytology, and repeat voided cytology in April 2024 showed atypical cells and she continued to defer surgery, but ultimately opted to continue with maintenance BCG.  Risk and benefits have been discussed extensively of deferring intervention with her positive cytology.  Initial Diagnosis of Bladder  Date: 12/2020 Pathology: CIS, early papillary change with possible focal lamina propria invasion  Recurrent Bladder Cancer Diagnosis  Date  Pathology  08/2021 CIS  Second round of induction BCG completed 11/2021 mBCG 03/2022 mBCG 07/2022 mBCG 05/2023 Declined ongoing BCG secondary to side effects of malaise/fatigue for few weeks after BCG treatments   AUA Risk Category High  Cystoscopy Procedure Note:  Keflex  given for prophylaxis  After informed consent and discussion of the procedure and its risks, Heather Nguyen was positioned and prepped in the standard fashion. Cystoscopy was performed with the a flexible cystoscope. The urethra, bladder neck  and entire bladder was visualized in a standard fashion. The ureteral orifices were visualized in their normal location and orientation.  Bladder mucosa overall normal.  Cytology deferred as she has previously declined biopsy with positive cytology or further BCG.  - RTC 6 months cystoscopy, she declines further BCG treatment -She denies any overactive symptoms or dysuria today, she has quit smoking   Redell Burnet, MD 03/19/2024

## 2024-09-16 ENCOUNTER — Other Ambulatory Visit: Admitting: Urology
